# Patient Record
Sex: Female | Born: 1970 | Race: White | Hispanic: No | Marital: Married | Smoking: Never smoker
Health system: Southern US, Community
[De-identification: ages and names within clinical notes are randomized; demographics above are authoritative.]

## PROBLEM LIST (undated history)

## (undated) DIAGNOSIS — N200 Calculus of kidney: Secondary | ICD-10-CM

## (undated) DIAGNOSIS — Z87442 Personal history of urinary calculi: Secondary | ICD-10-CM

## (undated) HISTORY — PX: SINUS IRRIGATION: SHX2411

## (undated) HISTORY — PX: RHINOPLASTY: SUR1284

## (undated) HISTORY — PX: INTRAUTERINE DEVICE INSERTION: SHX323

## (undated) HISTORY — PX: TONSILLECTOMY: SUR1361

---

## 2006-02-11 ENCOUNTER — Observation Stay: Payer: Self-pay

## 2006-02-25 ENCOUNTER — Inpatient Hospital Stay: Payer: Self-pay | Admitting: Obstetrics and Gynecology

## 2006-03-06 ENCOUNTER — Ambulatory Visit: Payer: Self-pay | Admitting: Anesthesiology

## 2006-03-09 ENCOUNTER — Ambulatory Visit: Payer: Self-pay | Admitting: Obstetrics and Gynecology

## 2007-06-04 ENCOUNTER — Ambulatory Visit: Payer: Self-pay | Admitting: Obstetrics and Gynecology

## 2008-06-13 ENCOUNTER — Ambulatory Visit: Payer: Self-pay | Admitting: Obstetrics and Gynecology

## 2009-12-14 ENCOUNTER — Ambulatory Visit: Payer: Self-pay | Admitting: Orthopedic Surgery

## 2011-06-24 ENCOUNTER — Ambulatory Visit: Payer: Self-pay | Admitting: Obstetrics and Gynecology

## 2012-06-29 ENCOUNTER — Ambulatory Visit: Payer: Self-pay | Admitting: Obstetrics and Gynecology

## 2015-05-07 ENCOUNTER — Other Ambulatory Visit: Payer: Self-pay | Admitting: Obstetrics and Gynecology

## 2015-05-07 DIAGNOSIS — Z1231 Encounter for screening mammogram for malignant neoplasm of breast: Secondary | ICD-10-CM

## 2015-05-16 ENCOUNTER — Ambulatory Visit
Admission: RE | Admit: 2015-05-16 | Discharge: 2015-05-16 | Disposition: A | Discharge status: Home / Self Care | Payer: 59 | Source: Ambulatory Visit | Attending: Obstetrics and Gynecology | Admitting: Obstetrics and Gynecology

## 2015-05-16 DIAGNOSIS — Z1231 Encounter for screening mammogram for malignant neoplasm of breast: Secondary | ICD-10-CM | POA: Insufficient documentation

## 2015-05-30 ENCOUNTER — Emergency Department: Payer: 59

## 2015-05-30 ENCOUNTER — Emergency Department
Admission: EM | Admit: 2015-05-30 | Discharge: 2015-05-30 | Disposition: A | Discharge status: Home / Self Care | Payer: 59 | Attending: Emergency Medicine | Admitting: Emergency Medicine

## 2015-05-30 DIAGNOSIS — N83201 Unspecified ovarian cyst, right side: Secondary | ICD-10-CM | POA: Diagnosis not present

## 2015-05-30 DIAGNOSIS — R1031 Right lower quadrant pain: Secondary | ICD-10-CM

## 2015-05-30 DIAGNOSIS — Z3202 Encounter for pregnancy test, result negative: Secondary | ICD-10-CM | POA: Diagnosis not present

## 2015-05-30 DIAGNOSIS — R110 Nausea: Secondary | ICD-10-CM

## 2015-05-30 HISTORY — DX: Calculus of kidney: N20.0

## 2015-05-30 LAB — CBC
HCT: 44.6 % (ref 35.0–47.0)
Hemoglobin: 15 g/dL (ref 12.0–16.0)
MCH: 29.9 pg (ref 26.0–34.0)
MCHC: 33.6 g/dL (ref 32.0–36.0)
MCV: 89.1 fL (ref 80.0–100.0)
PLATELETS: 259 10*3/uL (ref 150–440)
RBC: 5 MIL/uL (ref 3.80–5.20)
RDW: 12.8 % (ref 11.5–14.5)
WBC: 10.9 10*3/uL (ref 3.6–11.0)

## 2015-05-30 LAB — COMPREHENSIVE METABOLIC PANEL
ALT: 27 U/L (ref 14–54)
AST: 22 U/L (ref 15–41)
Albumin: 4.3 g/dL (ref 3.5–5.0)
Alkaline Phosphatase: 55 U/L (ref 38–126)
Anion gap: 7 (ref 5–15)
BUN: 13 mg/dL (ref 6–20)
CHLORIDE: 106 mmol/L (ref 101–111)
CO2: 25 mmol/L (ref 22–32)
CREATININE: 0.75 mg/dL (ref 0.44–1.00)
Calcium: 9 mg/dL (ref 8.9–10.3)
GFR calc non Af Amer: >60 mL/min (ref >60)
Glucose, Bld: 100 mg/dL — ABNORMAL HIGH (ref 65–99)
Potassium: 3.6 mmol/L (ref 3.5–5.1)
SODIUM: 138 mmol/L (ref 135–145)
Total Bilirubin: 0.7 mg/dL (ref 0.3–1.2)
Total Protein: 8.2 g/dL — ABNORMAL HIGH (ref 6.5–8.1)

## 2015-05-30 LAB — URINALYSIS COMPLETE WITH MICROSCOPIC (ARMC ONLY)
BACTERIA UA: NONE SEEN
Bilirubin Urine: NEGATIVE
Glucose, UA: NEGATIVE mg/dL
Ketones, ur: NEGATIVE mg/dL
LEUKOCYTES UA: NEGATIVE
Nitrite: NEGATIVE
PH: 7 (ref 5.0–8.0)
Protein, ur: NEGATIVE mg/dL
Specific Gravity, Urine: 1.008 (ref 1.005–1.030)

## 2015-05-30 LAB — POCT PREGNANCY, URINE: PREG TEST UR: NEGATIVE

## 2015-05-30 LAB — LIPASE, BLOOD: Lipase: 24 U/L (ref 11–51)

## 2015-05-30 MED ORDER — ONDANSETRON HCL 4 MG/2ML IJ SOLN
4.0000 mg | Freq: Once | INTRAMUSCULAR | Status: AC
  Administered 2015-05-30: 4 mg via INTRAVENOUS
  Filled 2015-05-30: qty 2

## 2015-05-30 MED ORDER — HYDROMORPHONE HCL 1 MG/ML IJ SOLN
0.5000 mg | Freq: Once | INTRAMUSCULAR | Status: AC
  Administered 2015-05-30: 0.5 mg via INTRAVENOUS
  Filled 2015-05-30: qty 1

## 2015-05-30 MED ORDER — SODIUM CHLORIDE 0.9 % IV SOLN: 1000.0000 mL | Freq: Once | INTRAVENOUS | Status: DC

## 2015-05-30 MED ORDER — SODIUM CHLORIDE 0.9 % IV BOLUS (SEPSIS)
1000.0000 mL | Freq: Once | INTRAVENOUS | Status: AC
  Administered 2015-05-30: 1000 mL via INTRAVENOUS

## 2015-05-30 MED ORDER — OXYCODONE-ACETAMINOPHEN 5-325 MG PO TABS
1.0000 | ORAL_TABLET | Freq: Once | ORAL | Status: AC
  Administered 2015-05-30: 1 via ORAL
  Filled 2015-05-30: qty 1

## 2015-05-30 MED ORDER — HYDROMORPHONE HCL 1 MG/ML IJ SOLN: 1.0000 mg | Freq: Once | INTRAMUSCULAR | Status: DC

## 2015-05-30 MED ORDER — IOHEXOL 240 MG/ML SOLN
25.0000 mL | Freq: Once | INTRAMUSCULAR | Status: AC | PRN
  Administered 2015-05-30: 25 mL via ORAL

## 2015-05-30 MED ORDER — OXYCODONE HCL 5 MG PO TABS: 5.0000 mg | ORAL_TABLET | ORAL | Status: AC | PRN

## 2015-05-30 MED ORDER — ONDANSETRON HCL 4 MG/2ML IJ SOLN: 4.0000 mg | Freq: Once | INTRAMUSCULAR | Status: DC

## 2015-05-30 MED ORDER — IOHEXOL 300 MG/ML  SOLN
100.0000 mL | Freq: Once | INTRAMUSCULAR | Status: AC | PRN
  Administered 2015-05-30: 100 mL via INTRAVENOUS

## 2015-05-30 MED ORDER — HYDROMORPHONE HCL 1 MG/ML IJ SOLN
1.0000 mg | Freq: Once | INTRAMUSCULAR | Status: AC
  Administered 2015-05-30: 1 mg via INTRAVENOUS
  Filled 2015-05-30: qty 1

## 2015-05-30 NOTE — ED Notes (Signed)
Pt c/o sudden onset RLQ pain that started around 630am this morning .Marland Kitchen. Denies N/V/fever.. States she has hx of kidney stones but this feels different..Marland Kitchen

## 2015-05-30 NOTE — Discharge Instructions (Signed)
Please make an appointment with your primary care physician for follow-up. You  will need a repeat pelvic ultrasound in 6-12 weeks to make sure that your ovarian cyst has resolved.   Please return to the emergency department if you develop severe pain, lightheadedness or fainting, fever, or any other symptoms concerning to you.

## 2015-05-30 NOTE — ED Notes (Signed)
Pt discharged home after verbalizing understanding of discharge instructions; nad noted. 

## 2015-05-30 NOTE — ED Provider Notes (Addendum)
[facility] Emergency Department Provider Note  ____________________________________________  Time seen: Approximately 8:56 AM  I have reviewed the triage vital signs and the nursing notes.   HISTORY  Chief Complaint Abdominal Pain    HPI Mary Ayers is a 44 y.o. female with a history of renal colic presenting with right lower quadrant pain and nausea. Patient states that at 6:15 she began to have a "sharp" pain in the right lower quadrant associated with nausea but no vomiting. No diarrhea, urinary symptoms, or change in vaginal discharge. No fever, chills. She tried to eat 2 bites of breakfast but then was not hungry anymore. The patient is sexually active and has an IUD. LMP 2 weeks ago.   Past Medical History  Diagnosis Date  . Kidney stone     There are no active problems to display for this patient.   Past Surgical History  Procedure Laterality Date  . Sinus irrigation      Current Outpatient Rx  Name  Route  Sig  Dispense  Refill  . oxyCODONE (ROXICODONE) 5 MG immediate release tablet   Oral   Take 1 tablet (5 mg total) by mouth every 4 (four) hours as needed.   10 tablet   0     Allergies Naproxen  No family history on file.  Social History Social History  Substance Use Topics  . Smoking status: Never Smoker   . Smokeless tobacco: None  . Alcohol Use: No    Review of Systems Constitutional: No fever/chills. No lightheadedness or syncope. Eyes: No visual changes. ENT: No sore throat. Cardiovascular: Denies chest pain, palpitations. Respiratory: Denies shortness of breath.  No cough. Gastrointestinal: As right lower quadrant abdominal pain.  Positive nausea, no vomiting.  No diarrhea.  No constipation. Genitourinary: Negative for dysuria. Negative for change in vaginal discharge. Musculoskeletal: Negative for back pain. Skin: Negative for rash. Neurological: Negative for headaches, focal weakness or  numbness.  10-point ROS otherwise negative.  ____________________________________________   PHYSICAL EXAM:  VITAL SIGNS: ED Triage Vitals  Enc Vitals Group     BP 05/30/15 0837 156/91 mmHg     Pulse Rate 05/30/15 0837 118     Resp 05/30/15 0837 18     Temp 05/30/15 0837 98.2 F (36.8 C)     Temp Source 05/30/15 0837 Oral     SpO2 05/30/15 0837 99 %     Weight 05/30/15 0837 140 lb (63.504 kg)     Height 05/30/15 0837 5\' 1"  (1.549 m)     Head Cir --      Peak Flow --      Pain Score 05/30/15 0838 5     Pain Loc --      Pain Edu? --      Excl. in GC? --     Constitutional: Alert and oriented. Well appearing and in no acute distress. Answer question appropriately. Eyes: Conjunctivae are normal.  EOMI. Head: Atraumatic. Nose: No congestion/rhinnorhea. Mouth/Throat: Mucous membranes are moist.  Neck: No stridor.  Supple.   Cardiovascular: Normal rate, regular rhythm. No murmurs, rubs or gallops.  Respiratory: Normal respiratory effort.  No retractions. Lungs CTAB.  No wheezes, rales or ronchi. Gastrointestinal: Soft and nondistended. Tender to palpation in the right lower quadrant. No Murphy sign. No guarding or rebound, no peritoneal signs. Musculoskeletal: No LE edema.  Neurologic:  Normal speech and language. No gross focal neurologic deficits are appreciated.  Skin:  Skin is warm, dry and intact. No rash  noted. Psychiatric: Mood and affect are normal. Speech and behavior are normal.  Normal judgement.  ____________________________________________   LABS (all labs ordered are listed, but only abnormal results are displayed)  Labs Reviewed  URINALYSIS COMPLETEWITH MICROSCOPIC ([facility] ONLY) - Abnormal; Notable for the following:    Color, Urine STRAW (*)    APPearance CLEAR (*)    Hgb urine dipstick 1+ (*)    Squamous Epithelial / LPF 0-5 (*)    All other components within normal limits  COMPREHENSIVE METABOLIC PANEL - Abnormal; Notable for the following:    Glucose,  Bld 100 (*)    Total Protein 8.2 (*)    All other components within normal limits  CBC  LIPASE, BLOOD  POC URINE PREG, ED  POCT PREGNANCY, URINE   ____________________________________________  EKG  Not indicated ____________________________________________  RADIOLOGY  US Transvaginal Non-ob  05/30/2015  CLINICAL DATA:  Acute onset right lower quadrant pain at 6:30 a.m. today. No known injury. EXAM: TRANSABDOMINAL AND TRANSVAGINAL ULTRASOUND OF PELVIS DOPPLER ULTRASOUND OF OVARIES TECHNIQUE: Both transabdominal and transvaginal ultrasound examinations of the pelvis were performed. Transabdominal technique was performed for global imaging of the pelvis including uterus, ovaries, adnexal regions, and pelvic cul-de-sac. It was necessary to proceed with endovaginal exam following the transabdominal exam to visualize the uterus and adnexa. Color and duplex Doppler ultrasound was utilized to evaluate blood flow to the ovaries. COMPARISON:  CT abdomen and pelvis earlier today. FINDINGS: Uterus Measurements: 8.8 x 4.7 x 5.0 cm. No fibroids or other mass visualized. Nabothian cysts are noted within the cervix. The largest measures 1 cm in diameter and contains a small amount of debris. Endometrium Thickness: 0.8 cm. IUD is in place. Several tiny sub endometrial cysts are identified. Right ovary Measurements: 4.5 x 3.6 x 3.3 cm. The majority of the right ovary is replaced by a cystic lesion which contains a single somewhat thin septation measuring 0.2 cm. There is no flow within the septation. No mural nodule is identified. Left ovary Measurements: 1.8 x 1.9 x 1.9 cm. Normal appearance/no adnexal mass. Pulsed Doppler evaluation of both ovaries demonstrates normal low-resistance arterial and venous waveforms. Other findings No free fluid. IMPRESSION: No acute abnormality. Right ovarian cyst with a single thin septation has benign features but follow-up ultrasound in 6-12 weeks is recommended. Electronically  Signed   By: Drusilla Kanner M.D.   On: 05/30/2015 14:21   US Pelvis Complete  05/30/2015  CLINICAL DATA:  Acute onset right lower quadrant pain at 6:30 a.m. today. No known injury. EXAM: TRANSABDOMINAL AND TRANSVAGINAL ULTRASOUND OF PELVIS DOPPLER ULTRASOUND OF OVARIES TECHNIQUE: Both transabdominal and transvaginal ultrasound examinations of the pelvis were performed. Transabdominal technique was performed for global imaging of the pelvis including uterus, ovaries, adnexal regions, and pelvic cul-de-sac. It was necessary to proceed with endovaginal exam following the transabdominal exam to visualize the uterus and adnexa. Color and duplex Doppler ultrasound was utilized to evaluate blood flow to the ovaries. COMPARISON:  CT abdomen and pelvis earlier today. FINDINGS: Uterus Measurements: 8.8 x 4.7 x 5.0 cm. No fibroids or other mass visualized. Nabothian cysts are noted within the cervix. The largest measures 1 cm in diameter and contains a small amount of debris. Endometrium Thickness: 0.8 cm. IUD is in place. Several tiny sub endometrial cysts are identified. Right ovary Measurements: 4.5 x 3.6 x 3.3 cm. The majority of the right ovary is replaced by a cystic lesion which contains a single somewhat thin septation measuring 0.2 cm. There is  no flow within the septation. No mural nodule is identified. Left ovary Measurements: 1.8 x 1.9 x 1.9 cm. Normal appearance/no adnexal mass. Pulsed Doppler evaluation of both ovaries demonstrates normal low-resistance arterial and venous waveforms. Other findings No free fluid. IMPRESSION: No acute abnormality. Right ovarian cyst with a single thin septation has benign features but follow-up ultrasound in 6-12 weeks is recommended. Electronically Signed   By: Drusilla Kanner M.D.   On: 05/30/2015 14:21   Ct Abdomen Pelvis W Contrast  05/30/2015  CLINICAL DATA:  Sudden onset right lower quadrant pain starting at 6:30 this morning. History of renal calculi. EXAM: CT  ABDOMEN AND PELVIS WITH CONTRAST TECHNIQUE: Multidetector CT imaging of the abdomen and pelvis was performed using the standard protocol following bolus administration of intravenous contrast. CONTRAST:  OMNIPAQUE IOHEXOL 300 MG/ML  SOLN COMPARISON:  Report from 09/16/1996. FINDINGS: Lower chest: Dependent subsegmental atelectasis in both lower lobes. Hepatobiliary: Unremarkable Pancreas: Unremarkable Spleen: Unremarkable Adrenals/Urinary Tract: In the left kidney lower pole there is a 0.8 by 0.4 cm cluster of 2 adjacent calculi (image 73, series 6) as well as a separate 3 mm calculus (image 80, series 6). No hydronephrosis or hydroureter. No ureteral calculus or bladder calculus. Adrenal glands normal. Stomach/Bowel: Mobile cecum, positioned in the left upper quadrant, with adjacent pericecal lymph nodes measuring up to 1 cm in short axis (image 35, series 6) with normal appearance of the appendix, no compelling findings of twisting/volvulus. Terminal ileum unremarkable. Vascular/Lymphatic: Normal sized pelvic and small retroperitoneal lymph nodes. Reproductive: IUD in the uterus appears well positioned. Cystic lesion of the right ovary measuring 5.2 by 3.3 by 3.2 cm with at least a single internal septation extending obliquely, as shown on image 51 series 6. Left ovary normal. Other: No supplemental non-categorized findings. Musculoskeletal: Central disc protrusion at L4-5 with internal calcification, extending caudad. No overt impingement. Transitional L5 vertebra. IMPRESSION: 1. A 5.2 cm right ovarian cyst with thin internal septation noted. This is a potential cause for the patient's right lower quadrant pain- dedicated pelvic sonography is recommended for further characterization. 2. Mobile cecum is in the left upper quadrant. No definite twisting or volvulus. Appendix appears unremarkable. 3. Left kidney lower pole nonobstructive calculi. 4. Central disc protrusion at L4-5 without impingement. 5. An IUD  in the uterus appears well positioned. Electronically Signed   By: Gaylyn Rong M.D.   On: 05/30/2015 11:08   [place] Art/ven Flow Abd Pelv Doppler  05/30/2015  CLINICAL DATA:  Acute onset right lower quadrant pain at 6:30 a.m. today. No known injury. EXAM: TRANSABDOMINAL AND TRANSVAGINAL ULTRASOUND OF PELVIS DOPPLER ULTRASOUND OF OVARIES TECHNIQUE: Both transabdominal and transvaginal ultrasound examinations of the pelvis were performed. Transabdominal technique was performed for global imaging of the pelvis including uterus, ovaries, adnexal regions, and pelvic cul-de-sac. It was necessary to proceed with endovaginal exam following the transabdominal exam to visualize the uterus and adnexa. Color and duplex Doppler ultrasound was utilized to evaluate blood flow to the ovaries. COMPARISON:  CT abdomen and pelvis earlier today. FINDINGS: Uterus Measurements: 8.8 x 4.7 x 5.0 cm. No fibroids or other mass visualized. Nabothian cysts are noted within the cervix. The largest measures 1 cm in diameter and contains a small amount of debris. Endometrium Thickness: 0.8 cm. IUD is in place. Several tiny sub endometrial cysts are identified. Right ovary Measurements: 4.5 x 3.6 x 3.3 cm. The majority of the right ovary is replaced by a cystic lesion which contains a single somewhat thin  septation measuring 0.2 cm. There is no flow within the septation. No mural nodule is identified. Left ovary Measurements: 1.8 x 1.9 x 1.9 cm. Normal appearance/no adnexal mass. Pulsed Doppler evaluation of both ovaries demonstrates normal low-resistance arterial and venous waveforms. Other findings No free fluid. IMPRESSION: No acute abnormality. Right ovarian cyst with a single thin septation has benign features but follow-up ultrasound in 6-12 weeks is recommended. Electronically Signed   By: Drusilla Kanner M.D.   On: 05/30/2015 14:21    ____________________________________________   PROCEDURES  Procedure(s) performed:  None  Critical Care performed: No ____________________________________________   INITIAL IMPRESSION / ASSESSMENT AND PLAN / ED COURSE  Pertinent labs & imaging results that were available during my care of the patient were reviewed by me and considered in my medical decision making (see chart for details).  44 y.o. female with a history of renal colic presenting with acute onset of sharp right lower quadrant pain associated with nausea. This is not similar to her previous episodes of renal colic. We'll evaluate the patient for appendicitis, but if his workup is negative O consider other etiologies including ovarian cyst, ovarian torsion which revealed less likely, colitis, or renal pathology.  ----------------------------------------- 11:49 AM on 05/30/2015 -----------------------------------------  Patient states that Dilaudid initially improved her pain but now the pain is coming back. She also states that her nausea has significantly improved with Zofran. Her CT scan shows a complicated right ovarian cyst so I will follow this with ultrasound. She does not have appendicitis.  ----------------------------------------- 2:33 PM on 05/30/2015 -----------------------------------------  Patient's pelvic ultrasound confirms a right ovarian cyst that has a single septation is benign in appearance. I have talked to the patient and let her know the results of her ultrasound, as well as the need to follow-up in 6-12 weeks for repeat ultrasound. Also written this on her discharge paperwork. We have discussed return precautions as well as follow-up instructions.  ____________________________________________  FINAL CLINICAL IMPRESSION(S) / ED DIAGNOSES  Final diagnoses:  Right ovarian cyst  Nausea without vomiting      NEW MEDICATIONS STARTED DURING THIS VISIT:  New Prescriptions   OXYCODONE (ROXICODONE) 5 MG IMMEDIATE RELEASE TABLET    Take 1 tablet (5 mg total) by mouth every 4 (four)  hours as needed.     Rockne Menghini, MD 05/30/15 1434  Rockne Menghini, MD 05/30/15 1434

## 2015-05-30 NOTE — ED Notes (Signed)
Patient transported to Ultrasound 

## 2015-05-30 NOTE — ED Notes (Signed)
Patient transported to CT 

## 2015-06-12 ENCOUNTER — Encounter
Admission: RE | Admit: 2015-06-12 | Discharge: 2015-06-12 | Disposition: A | Discharge status: Home / Self Care | Payer: 59 | Source: Ambulatory Visit | Attending: Orthopedic Surgery | Admitting: Orthopedic Surgery

## 2015-06-12 DIAGNOSIS — Z79899 Other long term (current) drug therapy: Secondary | ICD-10-CM | POA: Diagnosis not present

## 2015-06-12 DIAGNOSIS — Z9889 Other specified postprocedural states: Secondary | ICD-10-CM | POA: Diagnosis not present

## 2015-06-12 DIAGNOSIS — Z79891 Long term (current) use of opiate analgesic: Secondary | ICD-10-CM | POA: Diagnosis not present

## 2015-06-12 DIAGNOSIS — N200 Calculus of kidney: Secondary | ICD-10-CM | POA: Diagnosis not present

## 2015-06-12 LAB — PREGNANCY, URINE: Preg Test, Ur: NEGATIVE

## 2015-06-13 ENCOUNTER — Encounter: Payer: Self-pay | Admitting: *Deleted

## 2015-06-13 MED ORDER — LACTATED RINGERS IV SOLN: INTRAVENOUS | Status: DC

## 2015-06-13 MED ORDER — FAMOTIDINE 20 MG PO TABS: 20.0000 mg | ORAL_TABLET | Freq: Once | ORAL | Status: DC

## 2015-06-14 ENCOUNTER — Ambulatory Visit
Admission: RE | Admit: 2015-06-14 | Discharge: 2015-06-14 | Disposition: A | Discharge status: Home / Self Care | Payer: 59 | Source: Ambulatory Visit | Attending: Urology | Admitting: Urology

## 2015-06-14 ENCOUNTER — Encounter
Admission: RE | Disposition: A | Discharge status: Home / Self Care | Payer: Self-pay | Source: Ambulatory Visit | Attending: Urology

## 2015-06-14 ENCOUNTER — Encounter: Payer: Self-pay | Admitting: *Deleted

## 2015-06-14 DIAGNOSIS — Z79899 Other long term (current) drug therapy: Secondary | ICD-10-CM | POA: Insufficient documentation

## 2015-06-14 DIAGNOSIS — N200 Calculus of kidney: Secondary | ICD-10-CM | POA: Diagnosis not present

## 2015-06-14 DIAGNOSIS — Z79891 Long term (current) use of opiate analgesic: Secondary | ICD-10-CM | POA: Insufficient documentation

## 2015-06-14 DIAGNOSIS — Z9889 Other specified postprocedural states: Secondary | ICD-10-CM | POA: Insufficient documentation

## 2015-06-14 HISTORY — PX: EXTRACORPOREAL SHOCK WAVE LITHOTRIPSY: SHX1557

## 2015-06-14 SURGERY — LITHOTRIPSY, ESWL
Anesthesia: Moderate Sedation | Laterality: Left

## 2015-06-14 MED ORDER — LEVOFLOXACIN 500 MG PO TABS
ORAL_TABLET | ORAL | Status: AC
Start: 2015-06-14 — End: 2015-06-14
  Administered 2015-06-14: 500 mg via ORAL
  Filled 2015-06-14: qty 1

## 2015-06-14 MED ORDER — LEVOFLOXACIN 500 MG PO TABS: 500.0000 mg | ORAL_TABLET | Freq: Every day | ORAL | Status: DC

## 2015-06-14 MED ORDER — MORPHINE SULFATE (PF) 10 MG/ML IV SOLN
10.0000 mg | Freq: Once | INTRAVENOUS | Status: AC
  Administered 2015-06-14: 10 mg via INTRAMUSCULAR

## 2015-06-14 MED ORDER — DIPHENHYDRAMINE HCL 25 MG PO CAPS
25.0000 mg | ORAL_CAPSULE | ORAL | Status: AC
  Administered 2015-06-14: 25 mg via ORAL

## 2015-06-14 MED ORDER — MIDAZOLAM HCL 2 MG/2ML IJ SOLN
INTRAMUSCULAR | Status: AC
  Administered 2015-06-14: 1 mg via INTRAMUSCULAR
  Filled 2015-06-14: qty 2

## 2015-06-14 MED ORDER — DOCUSATE SODIUM 100 MG PO CAPS: 200.0000 mg | ORAL_CAPSULE | Freq: Two times a day (BID) | ORAL | Status: DC

## 2015-06-14 MED ORDER — TAMSULOSIN HCL 0.4 MG PO CAPS: 0.4000 mg | ORAL_CAPSULE | Freq: Every day | ORAL | Status: DC

## 2015-06-14 MED ORDER — MORPHINE SULFATE (PF) 10 MG/ML IV SOLN
INTRAVENOUS | Status: AC
  Administered 2015-06-14: 10 mg via INTRAMUSCULAR
  Filled 2015-06-14: qty 1

## 2015-06-14 MED ORDER — FUROSEMIDE 10 MG/ML IJ SOLN
10.0000 mg | Freq: Once | INTRAMUSCULAR | Status: AC
  Administered 2015-06-14: 10 mg via INTRAVENOUS

## 2015-06-14 MED ORDER — LEVOFLOXACIN 500 MG PO TABS
500.0000 mg | ORAL_TABLET | ORAL | Status: AC
  Administered 2015-06-14: 500 mg via ORAL

## 2015-06-14 MED ORDER — PROMETHAZINE HCL 25 MG/ML IJ SOLN
INTRAMUSCULAR | Status: AC
Start: 2015-06-14 — End: 2015-06-14
  Administered 2015-06-14: 25 mg via INTRAMUSCULAR
  Filled 2015-06-14: qty 1

## 2015-06-14 MED ORDER — DIPHENHYDRAMINE HCL 25 MG PO CAPS
ORAL_CAPSULE | ORAL | Status: AC
  Administered 2015-06-14: 25 mg via ORAL
  Filled 2015-06-14: qty 1

## 2015-06-14 MED ORDER — MIDAZOLAM HCL 2 MG/2ML IJ SOLN
1.0000 mg | Freq: Once | INTRAMUSCULAR | Status: AC
  Administered 2015-06-14: 1 mg via INTRAMUSCULAR

## 2015-06-14 MED ORDER — PROMETHAZINE HCL 25 MG/ML IJ SOLN
25.0000 mg | Freq: Once | INTRAMUSCULAR | Status: AC
  Administered 2015-06-14: 25 mg via INTRAMUSCULAR

## 2015-06-14 MED ORDER — DEXTROSE-NACL 5-0.45 % IV SOLN
INTRAVENOUS | Status: DC
  Administered 2015-06-14: 09:00:00 via INTRAVENOUS

## 2015-06-14 MED ORDER — ONDANSETRON 8 MG PO TBDP: 8.0000 mg | ORAL_TABLET | Freq: Four times a day (QID) | ORAL | Status: DC | PRN

## 2015-06-14 MED ORDER — FUROSEMIDE 10 MG/ML IJ SOLN
INTRAMUSCULAR | Status: AC
  Administered 2015-06-14: 10 mg via INTRAVENOUS
  Filled 2015-06-14: qty 2

## 2015-06-14 MED ORDER — NUCYNTA 50 MG PO TABS: 50.0000 mg | ORAL_TABLET | Freq: Four times a day (QID) | ORAL | Status: DC | PRN

## 2015-06-14 NOTE — OR Nursing (Signed)
Left flank with slight redness but no break in the skin.

## 2015-06-14 NOTE — Discharge Instructions (Addendum)
Dietary Guidelines to Help Prevent Kidney Stones °Your risk of kidney stones can be decreased by adjusting the foods you eat. The most important thing you can do is drink enough fluid. You should drink enough fluid to keep your urine clear or pale yellow. The following guidelines provide specific information for the type of kidney stone you have had. °GUIDELINES ACCORDING TO TYPE OF KIDNEY STONE °Calcium Oxalate Kidney Stones °· Reduce the amount of salt you eat. Foods that have a lot of salt cause your body to release excess calcium into your urine. The excess calcium can combine with a substance called oxalate to form kidney stones. °· Reduce the amount of animal protein you eat if the amount you eat is excessive. Animal protein causes your body to release excess calcium into your urine. Ask your dietitian how much protein from animal sources you should be eating. °· Avoid foods that are high in oxalates. If you take vitamins, they should have less than 500 mg of vitamin C. Your body turns vitamin C into oxalates. You do not need to avoid fruits and vegetables high in vitamin C. °Calcium Phosphate Kidney Stones °· Reduce the amount of salt you eat to help prevent the release of excess calcium into your urine. °· Reduce the amount of animal protein you eat if the amount you eat is excessive. Animal protein causes your body to release excess calcium into your urine. Ask your dietitian how much protein from animal sources you should be eating. °· Get enough calcium from food or take a calcium supplement (ask your dietitian for recommendations). Food sources of calcium that do not increase your risk of kidney stones include: °· Broccoli. °· Dairy products, such as cheese and yogurt. °· Pudding. °Uric Acid Kidney Stones °· Do not have more than 6 oz of animal protein per day. °FOOD SOURCES °Animal Protein Sources °· Meat (all types). °· Poultry. °· Eggs. °· Fish, seafood. °Foods High in Salt °· Salt seasonings. °· Soy  sauce. °· Teriyaki sauce. °· Cured and processed meats. °· Salted crackers and snack foods. °· Fast food. °· Canned soups and most canned foods. °Foods High in Oxalates °· Grains: °· Amaranth. °· Barley. °· Grits. °· Wheat germ. °· Bran. °· Buckwheat flour. °· All bran cereals. °· Pretzels. °· Whole wheat bread. °· Vegetables: °· Beans (wax). °· Beets and beet greens. °· Collard greens. °· Eggplant. °· Escarole. °· Leeks. °· Okra. °· Parsley. °· Rutabagas. °· Spinach. °· Swiss chard. °· Tomato paste. °· Fried potatoes. °· Sweet potatoes. °· Fruits: °· Red currants. °· Figs. °· Kiwi. °· Rhubarb. °· Meat and Other Protein Sources: °· Beans (dried). °· Soy burgers and other soybean products. °· Miso. °· Nuts (peanuts, almonds, pecans, cashews, hazelnuts). °· Nut butters. °· Sesame seeds and tahini (paste made of sesame seeds). °· Poppy seeds. °· Beverages: °· Chocolate drink mixes. °· Soy milk. °· Instant iced tea. °· Juices made from high-oxalate fruits or vegetables. °· Other: °· Carob. °· Chocolate. °· Fruitcake. °· Marmalades. °  °This information is not intended to replace advice given to you by your health care provider. Make sure you discuss any questions you have with your health care provider. °  °Document Released: 09/27/2010 Document Revised: 06/07/2013 Document Reviewed: 04/29/2013 °Elsevier Interactive Patient Education ©2016 [employer] ° °Kidney Stones °Kidney stones (urolithiasis) are deposits that form inside your kidneys. The intense pain is caused by the stone moving through the urinary tract. When the stone moves, the ureter   goes into spasm around the stone. The stone is usually passed in the urine.  °CAUSES  °· A disorder that makes certain neck glands produce too much parathyroid hormone (primary hyperparathyroidism). °· A buildup of uric acid crystals, similar to gout in your joints. °· Narrowing (stricture) of the ureter. °· A kidney obstruction present at birth (congenital  obstruction). °· Previous surgery on the kidney or ureters. °· Numerous kidney infections. °SYMPTOMS  °· Feeling sick to your stomach (nauseous). °· Throwing up (vomiting). °· Blood in the urine (hematuria). °· Pain that usually spreads (radiates) to the groin. °· Frequency or urgency of urination. °DIAGNOSIS  °· Taking a history and physical exam. °· Blood or urine tests. °· CT scan. °· Occasionally, an examination of the inside of the urinary bladder (cystoscopy) is performed. °TREATMENT  °· Observation. °· Increasing your fluid intake. °· Extracorporeal shock wave lithotripsy--This is a noninvasive procedure that uses shock waves to break up kidney stones. °· Surgery may be needed if you have severe pain or persistent obstruction. There are various surgical procedures. Most of the procedures are performed with the use of small instruments. Only small incisions are needed to accommodate these instruments, so recovery time is minimized. °The size, location, and chemical composition are all important variables that will determine the proper choice of action for you. Talk to your health care provider to better understand your situation so that you will minimize the risk of injury to yourself and your kidney.  °HOME CARE INSTRUCTIONS  °· Drink enough water and fluids to keep your urine clear or pale yellow. This will help you to pass the stone or stone fragments. °· Strain all urine through the provided strainer. Keep all particulate matter and stones for your health care provider to see. The stone causing the pain may be as small as a grain of salt. It is very important to use the strainer each and every time you pass your urine. The collection of your stone will allow your health care provider to analyze it and verify that a stone has actually passed. The stone analysis will often identify what you can do to reduce the incidence of recurrences. °· Only take over-the-counter or prescription medicines for pain,  discomfort, or fever as directed by your health care provider. °· Keep all follow-up visits as told by your health care provider. This is important. °· Get follow-up X-rays if required. The absence of pain does not always mean that the stone has passed. It may have only stopped moving. If the urine remains completely obstructed, it can cause loss of kidney function or even complete destruction of the kidney. It is your responsibility to make sure X-rays and follow-ups are completed. Ultrasounds of the kidney can show blockages and the status of the kidney. Ultrasounds are not associated with any radiation and can be performed easily in a matter of minutes. °· Make changes to your daily diet as told by your health care provider. You may be told to: °· Limit the amount of salt that you eat. °· Eat 5 or more servings of fruits and vegetables each day. °· Limit the amount of meat, poultry, fish, and eggs that you eat. °· Collect a 24-hour urine sample as told by your health care provider. You may need to collect another urine sample every 6-12 months. °SEEK MEDICAL CARE IF: °· You experience pain that is progressive and unresponsive to any pain medicine you have been prescribed. °SEEK IMMEDIATE MEDICAL CARE IF:  °· Pain   cannot be controlled with the prescribed medicine. °· You have a fever or shaking chills. °· The severity or intensity of pain increases over 18 hours and is not relieved by pain medicine. °· You develop a new onset of abdominal pain. °· You feel faint or pass out. °· You are unable to urinate. °  °This information is not intended to replace advice given to you by your health care provider. Make sure you discuss any questions you have with your health care provider. °  °Document Released: 06/02/2005 Document Revised: 02/21/2015 Document Reviewed: 11/03/2012 °Elsevier Interactive Patient Education ©2016 [employer] ° °Lithotripsy, Care After °Refer to this sheet in the next few weeks. These instructions  provide you with information on caring for yourself after your procedure. Your health care provider may also give you more specific instructions. Your treatment has been planned according to current medical practices, but problems sometimes occur. Call your health care provider if you have any problems or questions after your procedure. °WHAT TO EXPECT AFTER THE PROCEDURE  °· Your urine may have a red tinge for a few days after treatment. Blood loss is usually minimal. °· You may have soreness in the back or flank area. This usually goes away after a few days. The procedure can cause blotches or bruises on the back where the pressure wave enters the skin. These marks usually cause only minimal discomfort and should disappear in a short time. °· Stone fragments should begin to pass within 24 hours of treatment. However, a delayed passage is not unusual. °· You may have pain, discomfort, and feel sick to your stomach (nauseated) when the crushed fragments of stone are passed down the tube from the kidney to the bladder. Stone fragments can pass soon after the procedure and may last for up to 4-8 weeks. °· A small number of patients may have severe pain when stone fragments are not able to pass, which leads to an obstruction. °· If your stone is greater than 1 inch (2.5 cm) in diameter or if you have multiple stones that have a combined diameter greater than 1 inch (2.5 cm), you may require more than one treatment. °· If you had a stent placed prior to your procedure, you may experience some discomfort, especially during urination. You may experience the pain or discomfort in your flank or back, or you may experience a sharp pain or discomfort at the base of your penis or in your lower abdomen. The discomfort usually lasts only a few minutes after urinating. °HOME CARE INSTRUCTIONS  °· Rest at home until you feel your energy improving. °· Only take over-the-counter or prescription medicines for pain, discomfort, or  fever as directed by your health care provider. Depending on the type of lithotripsy, you may need to take antibiotics and anti-inflammatory medicines for a few days. °· Drink enough water and fluids to keep your urine clear or pale yellow. This helps "flush" your kidneys. It helps pass any remaining pieces of stone and prevents stones from coming back. °· Most people can resume daily activities within 1-2 days after standard lithotripsy. It can take longer to recover from laser and percutaneous lithotripsy. °· Strain all urine through the provided strainer. Keep all particulate matter and stones for your health care provider to see. The stone may be as small as a grain of salt. It is very important to use the strainer each and every time you pass your urine. Any stones that are found can be sent to   a medical lab for examination. °· Visit your health care provider for a follow-up appointment in a few weeks. Your doctor may remove your stent if you have one. Your health care provider will also check to see whether stone particles still remain. °SEEK MEDICAL CARE IF:  °· Your pain is not relieved by medicine. °· You have a lasting nauseous feeling. °· You feel there is too much blood in the urine. °· You develop persistent problems with frequent or painful urination that does not at least partially improve after 2 days following the procedure. °· You have a congested cough. °· You feel lightheaded. °· You develop a rash or any other signs that might suggest an allergic problem. °· You develop any reaction or side effects to your medicine(s). °SEEK IMMEDIATE MEDICAL CARE IF:  °· You experience severe back or flank pain or both. °· You see nothing but blood when you urinate. °· You cannot pass any urine at all. °· You have a fever or shaking chills. °· You develop shortness of breath, difficulty breathing, or chest pain. °· You develop vomiting that will not stop after 6-8 hours. °· You have a fainting episode. °  °This  information is not intended to replace advice given to you by your health care provider. Make sure you discuss any questions you have with your health care provider. °  °Document Released: 06/22/2007 Document Revised: 02/21/2015 Document Reviewed: 12/16/2012 °Elsevier Interactive Patient Education ©2016 [employer] ° °Lithotripsy °Lithotripsy is a treatment that can sometimes help eliminate kidney stones and pain that they cause. A form of lithotripsy, also known as extracorporeal shock wave lithotripsy, is a nonsurgical procedure that helps your body rid itself of the kidney stone when it is too big to pass on its own. Extracorporeal shock wave lithotripsy is a method of crushing a kidney stone with shock waves. These shock waves pass through your body and are focused on your stone. They cause the kidney stones to crumble while still in the urinary tract. It is then easier for the smaller pieces of stone to pass in the urine. °Lithotripsy usually takes about an hour. It is done in a hospital, a lithotripsy center, or a mobile unit. It usually does not require an overnight stay. Your health care provider will instruct you on preparation for the procedure. Your health care provider will tell you what to expect afterward. °LET YOUR HEALTH CARE PROVIDER KNOW ABOUT: °· Any allergies you have. °· All medicines you are taking, including vitamins, herbs, eye drops, creams, and over-the-counter medicines. °· Previous problems you or members of your family have had with the use of anesthetics. °· Any blood disorders you have. °· Previous surgeries you have had. °· Medical conditions you have. °RISKS AND COMPLICATIONS °Generally, lithotripsy for kidney stones is a safe procedure. However, as with any procedure, complications can occur. Possible complications include: °· Infection. °· Bleeding of the kidney. °· Bruising of the kidney or skin. °· Obstruction of the ureter. °· Failure of the stone to fragment. °BEFORE THE  PROCEDURE °· Do not eat or drink for 6-8 hours prior to the procedure. You may, however, take the medications with a sip of water that your physician instructs you to take °· Do not take aspirin or aspirin-containing products for 7 days prior to your procedure °· Do not take nonsteroidal anti-inflammatory products for 7 days prior to your procedure °PROCEDURE °A stent (flexible tube with holes) may be placed in your ureter. The ureter is   the tube that transports the urine from the kidneys to the bladder. Your health care provider may place a stent before the procedure. This will help keep urine flowing from the kidney if the fragments of the stone block the ureter. You may have an IV tube placed in one of your veins to give you fluids and medicines. These medicines may help you relax or make you sleep. During the procedure, you will lie comfortably on a fluid-filled cushion or in a warm-water bath. After an X-ray or ultrasound exam to locate your stone, shock waves are aimed at the stone. If you are awake, you may feel a tapping sensation as the shock waves pass through your body. If large stone particles remain after treatment, a second procedure may be necessary at a later date. °For comfort during the test: °· Relax as much as possible. °· Try to remain still as much as possible. °· Try to follow instructions to speed up the test. °· Let your health care provider know if you are uncomfortable, anxious, or in pain. °AFTER THE PROCEDURE  °After surgery, you will be taken to the recovery area. A nurse will watch and check your progress. Once you're awake, stable, and taking fluids well, you will be allowed to go home as long as there are no problems. You will also be allowed to pass your urine before discharge. You may be given antibiotics to help prevent infection. You may also be prescribed pain medicine if needed. In a week or two, your health care provider may remove your stent, if you have one. You may first  have an X-ray exam to check on how successful the fragmentation of your stone has been and how much of the stone has passed. Your health care provider will check to see whether or not stone particles remain. °SEEK IMMEDIATE MEDICAL CARE IF: °· You develop a fever or shaking chills. °· Your pain is not relieved by medicine. °· You feel sick to your stomach (nauseated) and you vomit. °· You develop heavy bleeding. °· You have difficulty urinating. °· You start to pass your stent from your penis. °  °This information is not intended to replace advice given to you by your health care provider. Make sure you discuss any questions you have with your health care provider. °  °Document Released: 05/30/2000 Document Revised: 06/23/2014 Document Reviewed: 12/16/2012 °Elsevier Interactive Patient Education ©2016 [employer] ° °Renal Colic °Renal colic is pain that is caused by passing a kidney stone. The pain can be sharp and severe. It may be felt in the back, abdomen, side (flank), or groin. It can cause nausea. Renal colic can come and go. °HOME CARE INSTRUCTIONS °Watch your condition for any changes. The following actions may help to lessen any discomfort that you are feeling: °· Take medicines only as directed by your health care provider. °· Ask your health care provider if it is okay to take over-the-counter pain medicine. °· Drink enough fluid to keep your urine clear or pale yellow. Drink 6-8 glasses of water each day. °· Limit the amount of salt that you eat to less than 2 grams per day. °· Reduce the amount of protein in your diet. Eat less meat, fish, nuts, and dairy. °· Avoid foods such as spinach, rhubarb, nuts, or bran. These may make kidney stones more likely to form. °SEEK MEDICAL CARE IF: °· You have a fever or chills. °· Your urine smells bad or looks cloudy. °· You have pain or   burning when you pass urine. °SEEK IMMEDIATE MEDICAL CARE IF: °· Your flank pain or groin pain suddenly worsens. °· You become  confused or disoriented or you lose consciousness. °  °This information is not intended to replace advice given to you by your health care provider. Make sure you discuss any questions you have with your health care provider. °  °Document Released: 03/12/2005 Document Revised: 06/23/2014 Document Reviewed: 04/12/2014 °Elsevier Interactive Patient Education ©2016 [employer] ° °

## 2015-06-14 NOTE — OR Nursing (Signed)
Urine bloody but without clots or fragments.

## 2016-03-23 IMAGING — US US TRANSVAGINAL NON-OB
1 series · 13 of 25 positions shown · non-contrast
Comparison: CT abdomen and pelvis earlier today.

CLINICAL DATA: Acute onset right lower quadrant pain at [DATE] a.m.
today. No known injury.

EXAM:
TRANSABDOMINAL AND TRANSVAGINAL ULTRASOUND OF PELVIS
DOPPLER ULTRASOUND OF OVARIES
TECHNIQUE: Both transabdominal and transvaginal ultrasound examinations of the
pelvis were performed. Transabdominal technique was performed for
global imaging of the pelvis including uterus, ovaries, adnexal
regions, and pelvic cul-de-sac.
It was necessary to proceed with endovaginal exam following the
transabdominal exam to visualize the uterus and adnexa. Color and
duplex Doppler ultrasound was utilized to evaluate blood flow to the
ovaries.

[Series 1: us transvaginal non-ob · 0.22mm/px · 13 of 159 slices shown]
[im 1/159]
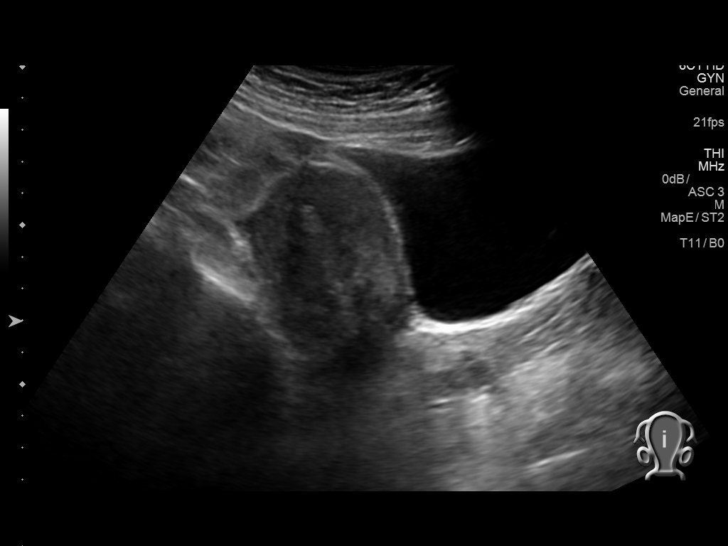
[im 14/159]
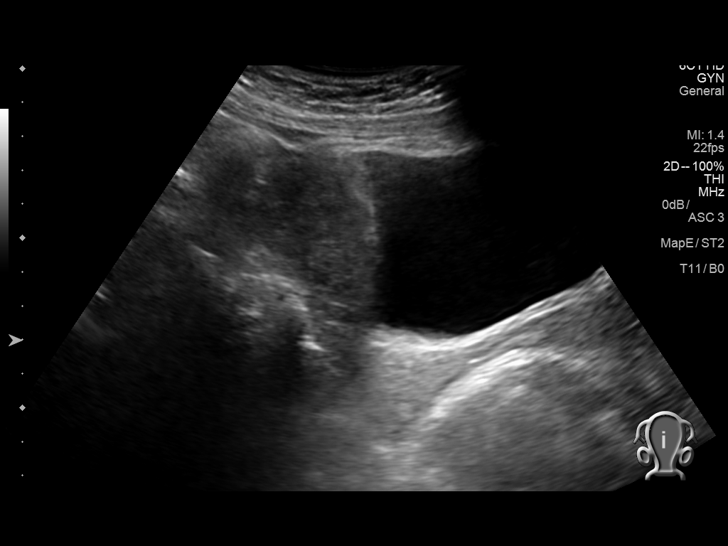
[im 27/159]
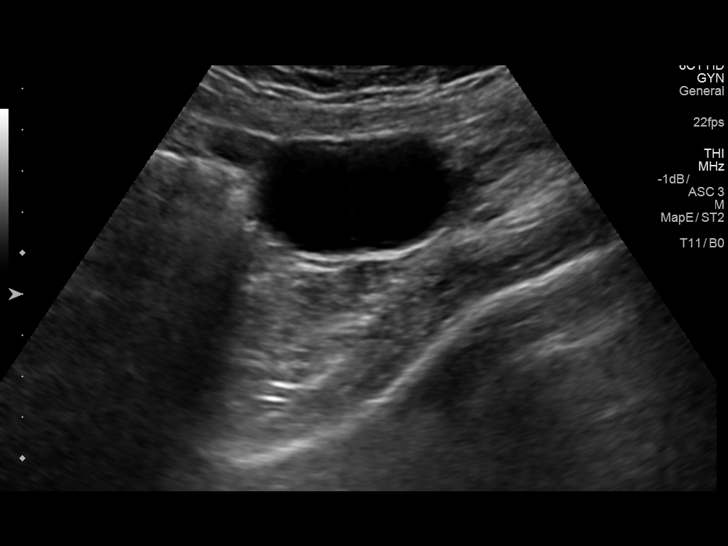
[im 40/159]
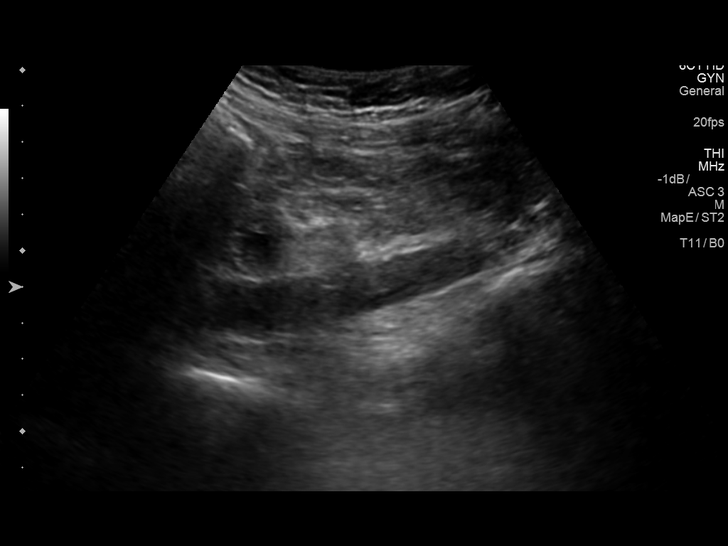
[im 53/159]
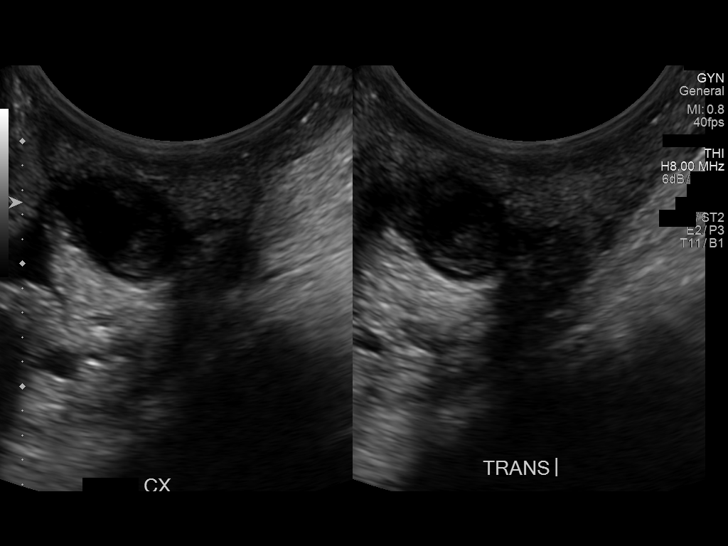
[im 66/159]
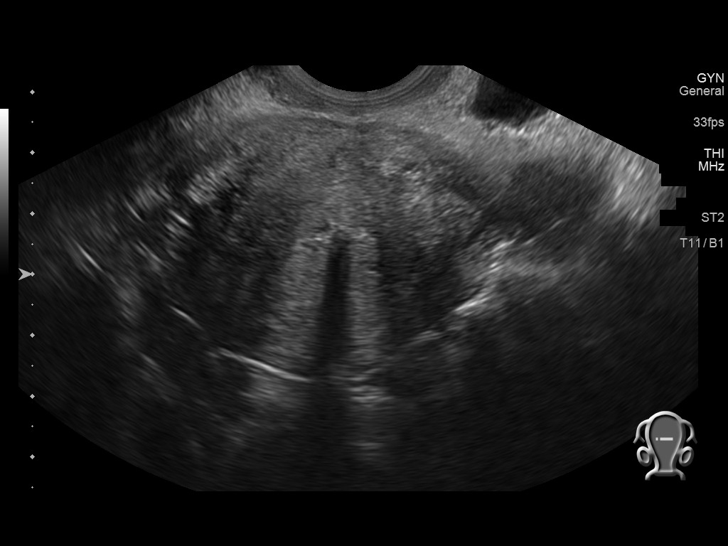
[im 80/159]
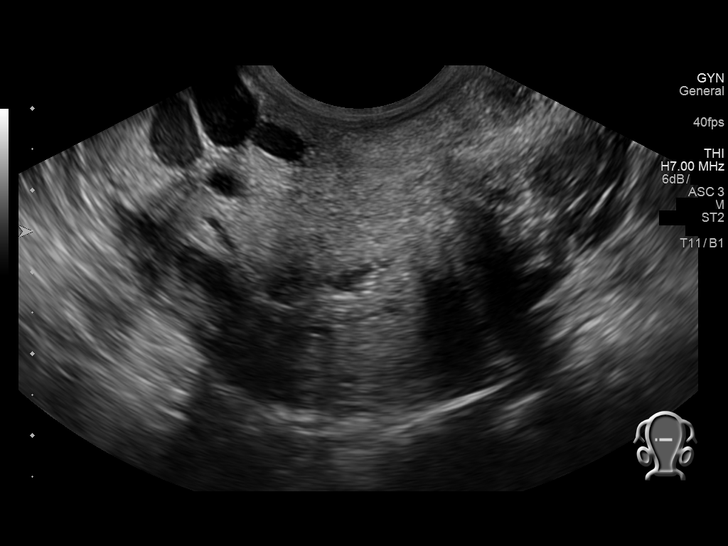
[im 93/159]
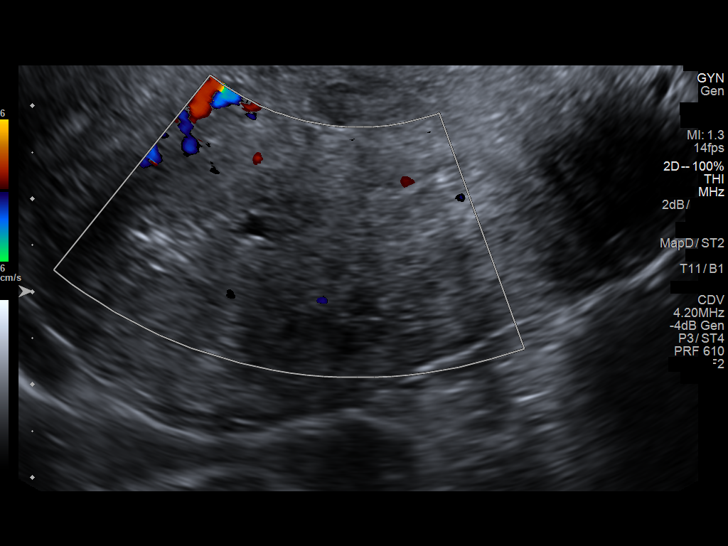
[im 106/159]
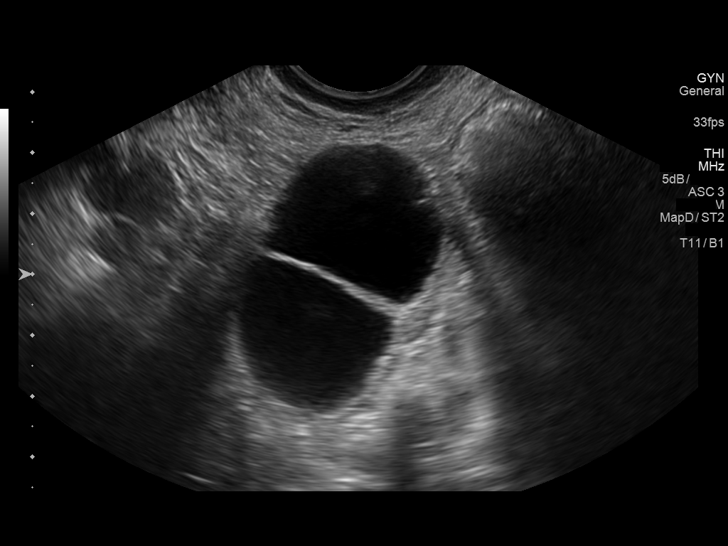
[im 119/159]
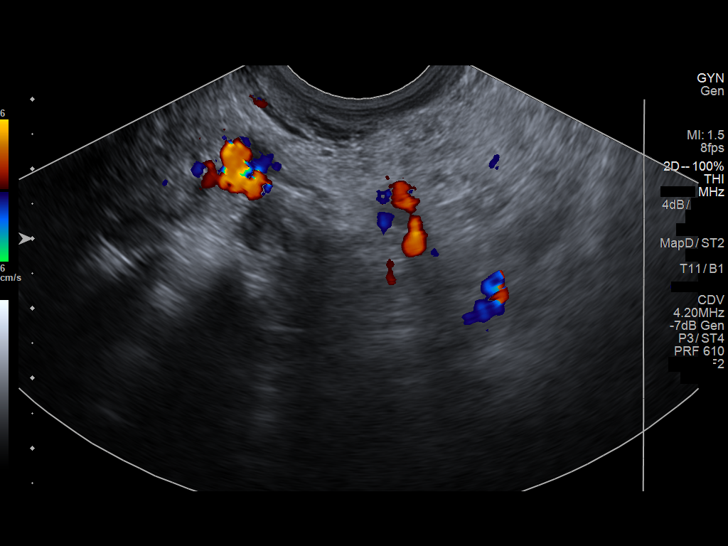
[im 132/159]
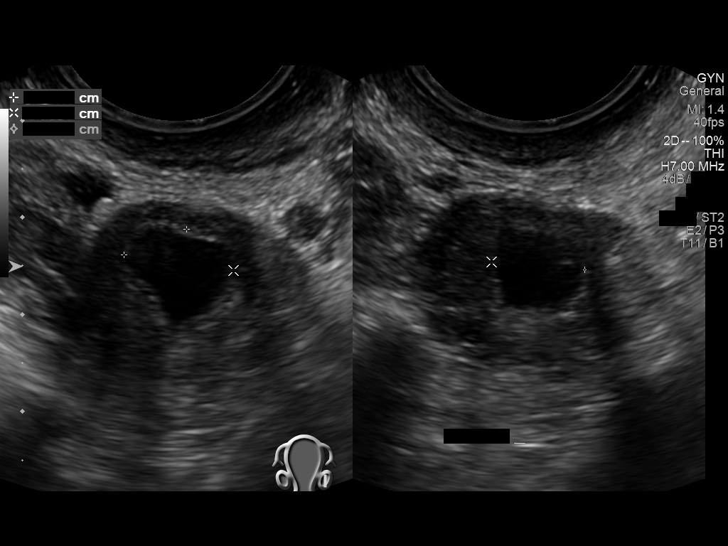
[im 145/159]
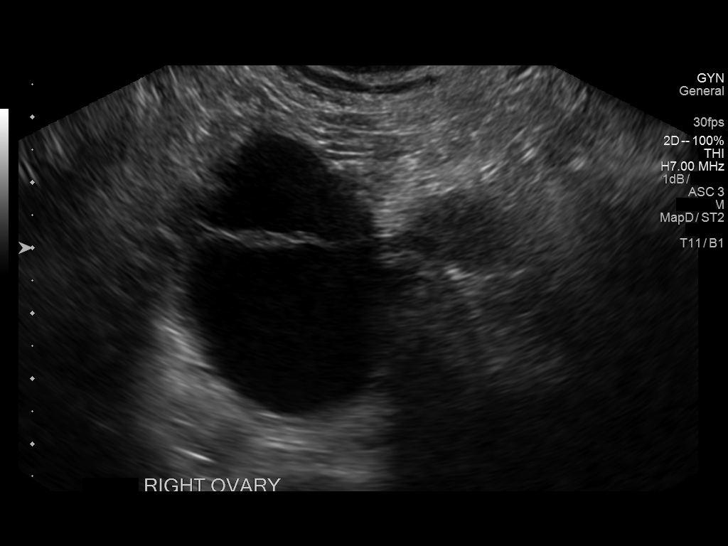
[im 159/159]
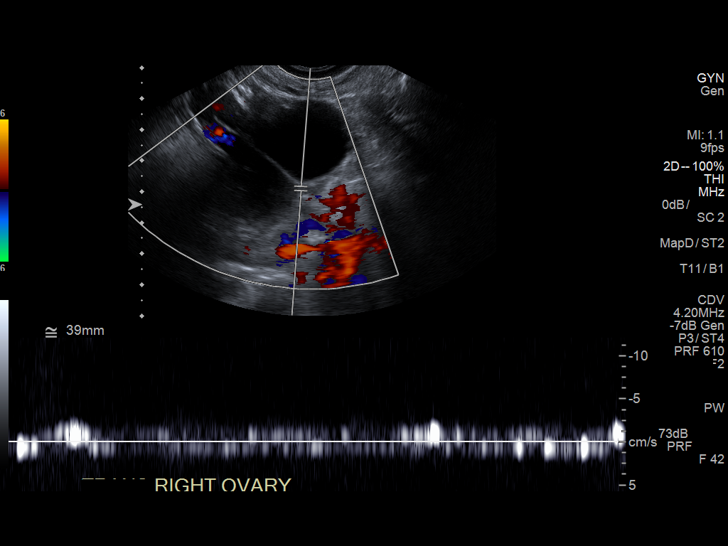

[13 of 25 positions shown; findings below may reference images not displayed]

FINDINGS: Uterus

Measurements: 8.8 x 4.7 x 5.0 cm. No fibroids or other mass
visualized. Nabothian cysts are noted within the cervix. The largest
measures 1 cm in diameter and contains a small amount of debris.

Endometrium

Thickness: 0.8 cm. IUD is in place. Several tiny sub endometrial
cysts are identified.

Right ovary

Measurements: 4.5 x 3.6 x 3.3 cm. The majority of the right ovary is
replaced by a cystic lesion which contains a single somewhat thin
septation measuring 0.2 cm. There is no flow within the septation.
No mural nodule is identified.

Left ovary

Measurements: 1.8 x 1.9 x 1.9 cm. Normal appearance/no adnexal mass.

Pulsed Doppler evaluation of both ovaries demonstrates normal
low-resistance arterial and venous waveforms.

Other findings

No free fluid.
IMPRESSION: No acute abnormality.

Right ovarian cyst with a single thin septation has benign features
but follow-up ultrasound in 6-12 weeks is recommended.

## 2016-04-29 DIAGNOSIS — N200 Calculus of kidney: Secondary | ICD-10-CM | POA: Insufficient documentation

## 2016-06-12 ENCOUNTER — Other Ambulatory Visit: Payer: Self-pay | Admitting: Obstetrics and Gynecology

## 2016-06-12 DIAGNOSIS — Z1231 Encounter for screening mammogram for malignant neoplasm of breast: Secondary | ICD-10-CM

## 2016-07-09 ENCOUNTER — Encounter: Payer: Self-pay | Admitting: *Deleted

## 2016-07-09 ENCOUNTER — Ambulatory Visit (INDEPENDENT_AMBULATORY_CARE_PROVIDER_SITE_OTHER): Payer: 59 | Admitting: Podiatry

## 2016-07-09 ENCOUNTER — Other Ambulatory Visit: Payer: Self-pay | Admitting: *Deleted

## 2016-07-09 ENCOUNTER — Encounter: Payer: Self-pay | Admitting: Podiatry

## 2016-07-09 VITALS — BP 114/71 | HR 86 | Resp 16

## 2016-07-09 DIAGNOSIS — L603 Nail dystrophy: Secondary | ICD-10-CM

## 2016-07-09 DIAGNOSIS — N83201 Unspecified ovarian cyst, right side: Secondary | ICD-10-CM | POA: Insufficient documentation

## 2016-07-09 DIAGNOSIS — R102 Pelvic and perineal pain: Secondary | ICD-10-CM | POA: Insufficient documentation

## 2016-07-09 DIAGNOSIS — L03031 Cellulitis of right toe: Secondary | ICD-10-CM | POA: Diagnosis not present

## 2016-07-09 MED ORDER — NEOMYCIN-POLYMYXIN-HC 1 % OT SOLN: OTIC | 1 refills | Status: DC

## 2016-07-09 NOTE — Progress Notes (Signed)
   Subjective:    Patient ID: Mary Ayers, female    DOB: [birth year], 46 y.o.   MRN: [id number]  HPI: She presents today as a new patient with trauma to the right hallux states that she fell in September 2017 and bruised the toe and as the nail has been growing out the distal aspect of the toenail has become discolored and has split and is throbbing. She states that she has to keep a Band-Aid on it just to keep it from catching in her socks.    Review of Systems  All other systems reviewed and are negative.      Objective:   Physical Exam: Vital signs are stable alert and oriented 3. Pulses are palpable. Neurologic sensorium is intact. Deep tendon reflexes are intact. Muscle strength is normal symmetrical bilateral. Orthopedic evaluation deferred. Cutaneous evaluationof a well-hydrated cutis no erythema edema cellulitis drainage or odor hallux nail plate is full and partially of all distally. It is exquisitely tender on palpation to see no signs of infection.          Assessment & Plan:  Nail trauma with distal onycholysis and splitting of the nail.  Plan: Nail avulsion after local anesthesia was achieved she tolerated procedure well was provided with oral and written home-going instructions for care and soaking of her toe. She was also provided with a prescription for Cortisporin Otic be applied twice daily at soaking I'll follow up with her in 1-2 weeks.

## 2016-07-09 NOTE — Patient Instructions (Signed)

## 2016-07-14 ENCOUNTER — Ambulatory Visit
Admission: RE | Admit: 2016-07-14 | Discharge: 2016-07-14 | Disposition: A | Discharge status: Home / Self Care | Payer: 59 | Source: Ambulatory Visit | Attending: Obstetrics and Gynecology | Admitting: Obstetrics and Gynecology

## 2016-07-14 ENCOUNTER — Other Ambulatory Visit: Payer: Self-pay | Admitting: Obstetrics and Gynecology

## 2016-07-14 DIAGNOSIS — N6489 Other specified disorders of breast: Secondary | ICD-10-CM | POA: Diagnosis not present

## 2016-07-14 DIAGNOSIS — Z1231 Encounter for screening mammogram for malignant neoplasm of breast: Secondary | ICD-10-CM

## 2016-07-17 ENCOUNTER — Other Ambulatory Visit: Payer: Self-pay | Admitting: Obstetrics and Gynecology

## 2016-07-17 DIAGNOSIS — R928 Other abnormal and inconclusive findings on diagnostic imaging of breast: Secondary | ICD-10-CM

## 2016-07-17 DIAGNOSIS — N6320 Unspecified lump in the left breast, unspecified quadrant: Secondary | ICD-10-CM

## 2016-07-17 DIAGNOSIS — N6489 Other specified disorders of breast: Secondary | ICD-10-CM

## 2016-07-21 ENCOUNTER — Encounter: Payer: Self-pay | Admitting: Podiatry

## 2016-07-21 ENCOUNTER — Ambulatory Visit (INDEPENDENT_AMBULATORY_CARE_PROVIDER_SITE_OTHER): Payer: Self-pay | Admitting: Podiatry

## 2016-07-21 DIAGNOSIS — L03031 Cellulitis of right toe: Secondary | ICD-10-CM

## 2016-07-21 NOTE — Progress Notes (Signed)
She presents today for follow-up of her nail avulsion hallux right she states this seems to be doing pretty good is still little touchy at times.  Objective: Portion of the nail still remaining no erythema edema cellulitis drainage or odor.  Assessment: Well-healing surgical toe hallux right.  Plan: I encouraged her to soak in Epsom salts and warm water but I also encouraged her to continue to apply Vaseline to the tip of the nail.

## 2016-07-21 NOTE — Patient Instructions (Signed)

## 2016-07-29 ENCOUNTER — Ambulatory Visit
Admission: RE | Admit: 2016-07-29 | Discharge: 2016-07-29 | Disposition: A | Discharge status: Home / Self Care | Payer: 59 | Source: Ambulatory Visit | Attending: Obstetrics and Gynecology | Admitting: Obstetrics and Gynecology

## 2016-07-29 DIAGNOSIS — R928 Other abnormal and inconclusive findings on diagnostic imaging of breast: Secondary | ICD-10-CM

## 2016-07-29 DIAGNOSIS — N651 Disproportion of reconstructed breast: Secondary | ICD-10-CM | POA: Diagnosis present

## 2016-07-29 DIAGNOSIS — N6489 Other specified disorders of breast: Secondary | ICD-10-CM | POA: Insufficient documentation

## 2016-07-29 DIAGNOSIS — N63 Unspecified lump in unspecified breast: Secondary | ICD-10-CM | POA: Diagnosis present

## 2016-07-29 DIAGNOSIS — N6320 Unspecified lump in the left breast, unspecified quadrant: Secondary | ICD-10-CM

## 2016-09-22 ENCOUNTER — Other Ambulatory Visit: Payer: Self-pay | Admitting: Obstetrics and Gynecology

## 2016-09-22 DIAGNOSIS — N6489 Other specified disorders of breast: Secondary | ICD-10-CM

## 2017-01-15 IMAGING — CT CT ABD-PELV W/ CM
1 of 3 series · 13 of 32 positions shown, 18 images · IV contrast (omnipaque)
Comparison: Report from 09/16/1996.

CLINICAL DATA: Sudden onset right lower quadrant pain starting at
[DATE] this morning. History of renal calculi.

EXAM:
CT ABDOMEN AND PELVIS WITH CONTRAST
TECHNIQUE: Multidetector CT imaging of the abdomen and pelvis was performed
using the standard protocol following bolus administration of
intravenous contrast.
CONTRAST:  100mL OMNIPAQUE IOHEXOL 300 MG/ML  SOLN

[Series 2: routine abd pel with · axial · 0.70mm/px · z∈[-794,-400]mm · 13 of 89 slices shown, 18 images]
[im 5/89  soft-tissue]
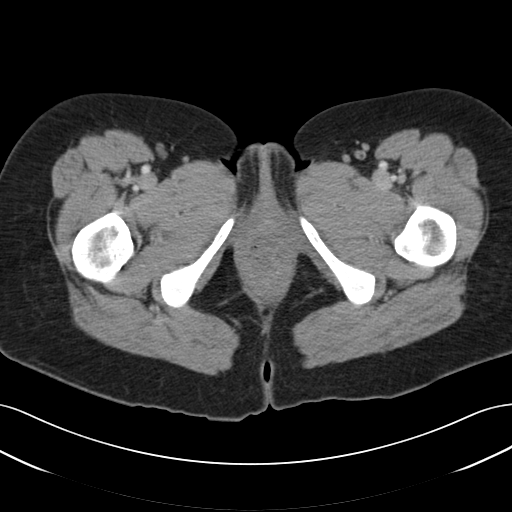
[im 5/89  bone]
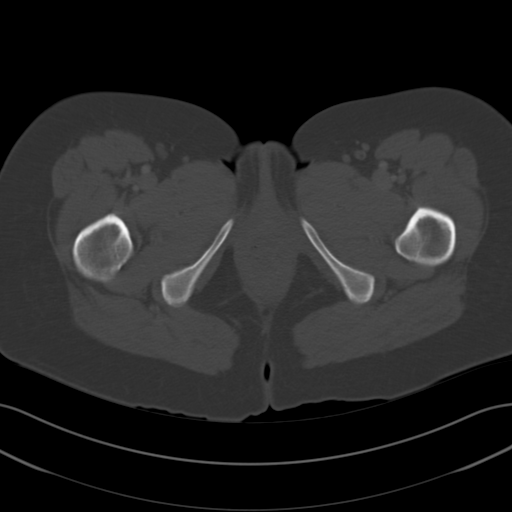
[im 15/89  soft-tissue]
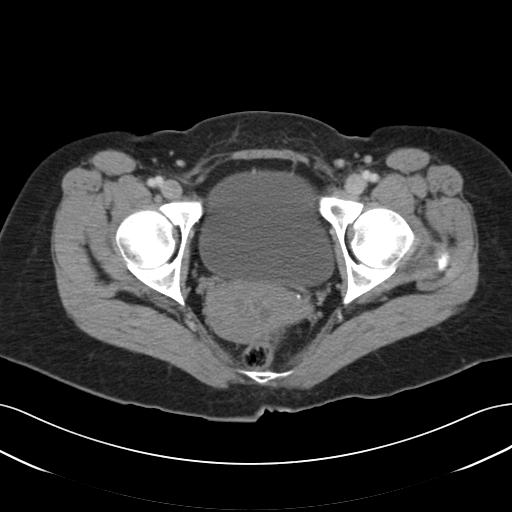
[im 20/89  soft-tissue]
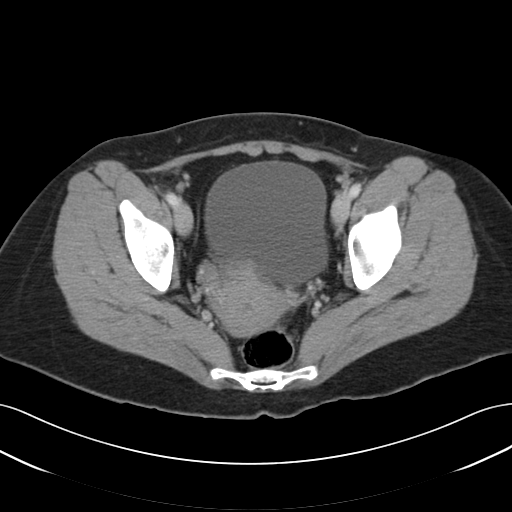
[im 25/89  soft-tissue]
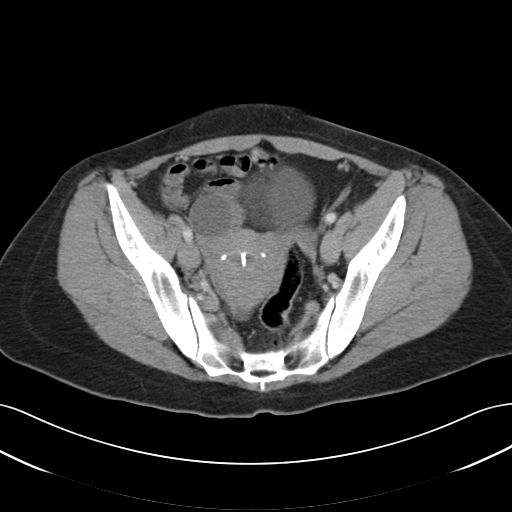
[im 35/89  soft-tissue]
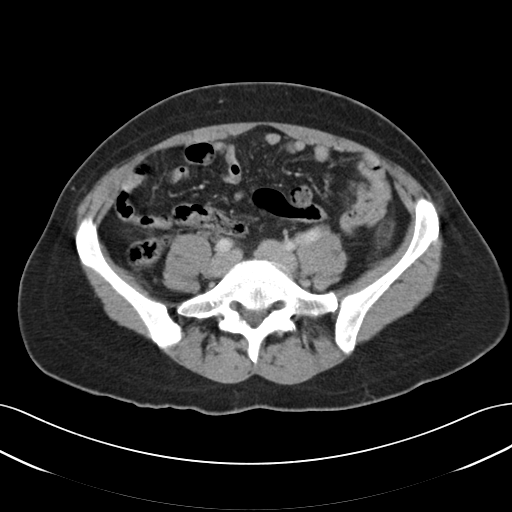
[im 40/89  soft-tissue]
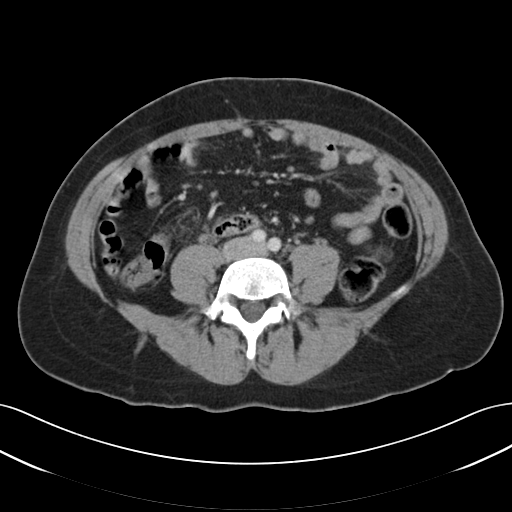
[im 49/89  soft-tissue]
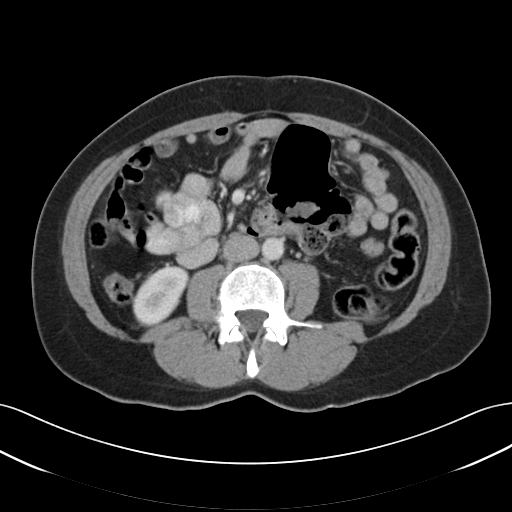
[im 54/89  soft-tissue]
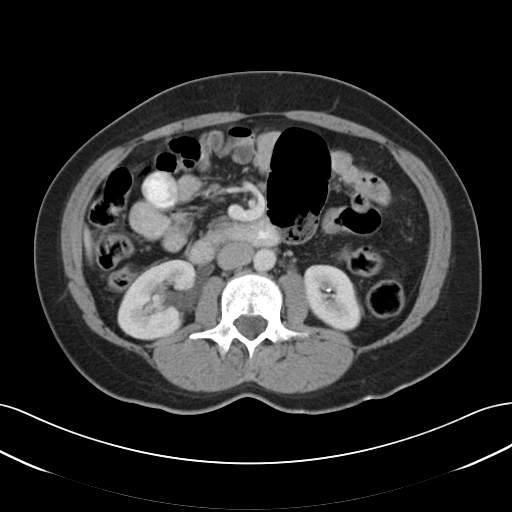
[im 64/89  soft-tissue]
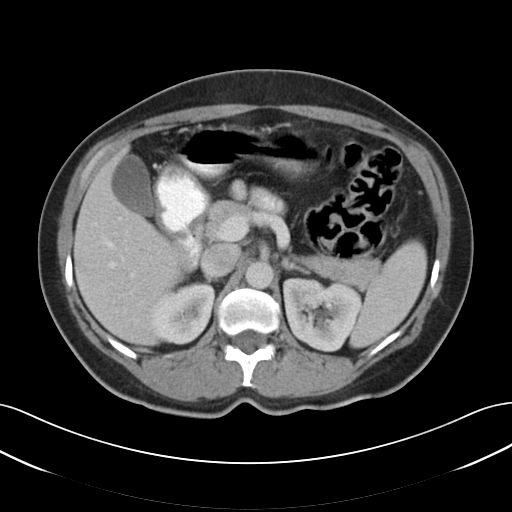
[im 64/89  bone]
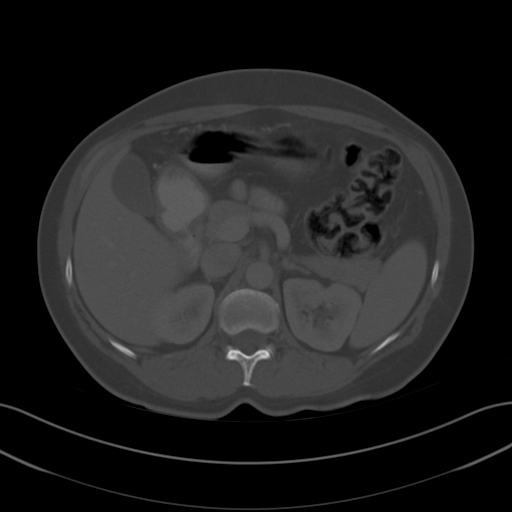
[im 69/89  soft-tissue]
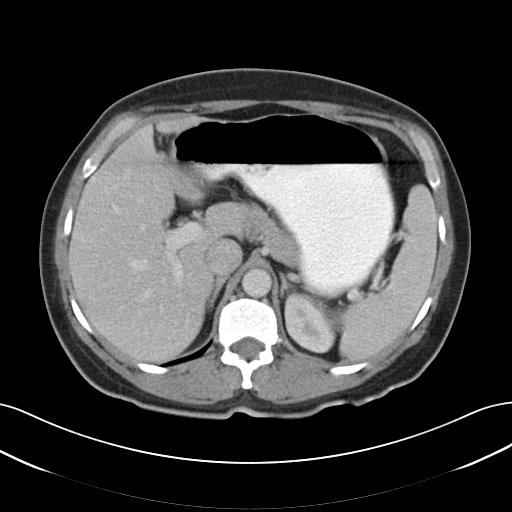
[im 69/89  lung]
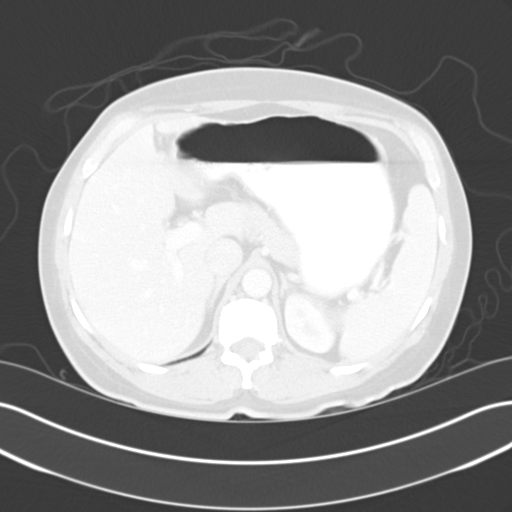
[im 74/89  soft-tissue]
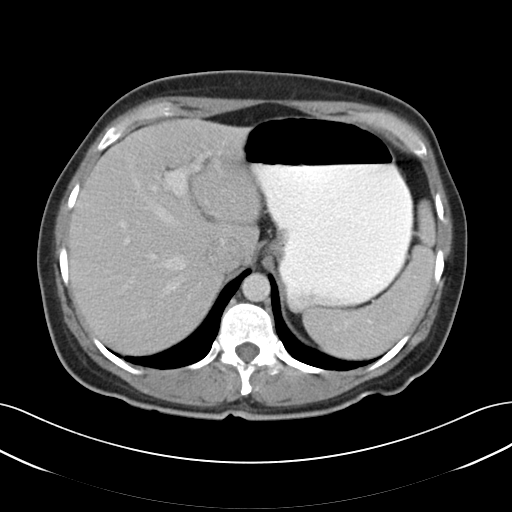
[im 74/89  lung]
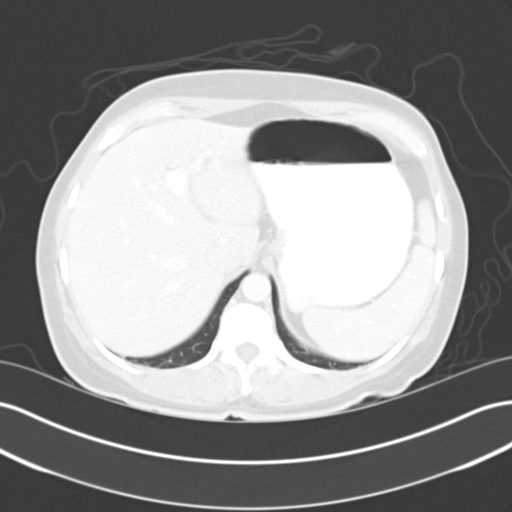
[im 79/89  lung]
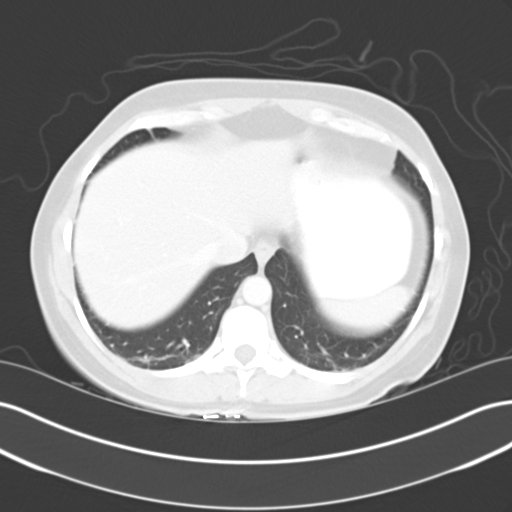
[im 84/89  soft-tissue]
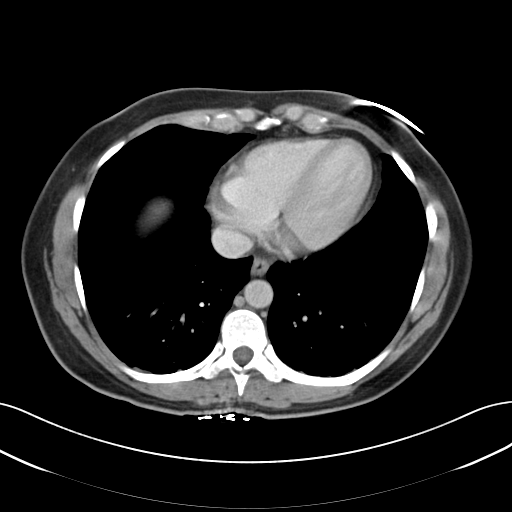
[im 84/89  lung]
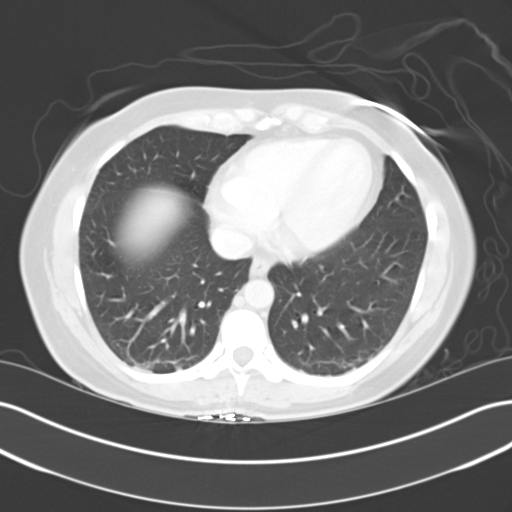

[13 of 32 positions shown; findings below may reference images not displayed]

FINDINGS: Lower chest: Dependent subsegmental atelectasis in both lower lobes.

Hepatobiliary: Unremarkable

Pancreas: Unremarkable

Spleen: Unremarkable

Adrenals/Urinary Tract: In the left kidney lower pole there is a
by 0.4 cm cluster of 2 adjacent calculi (image 73, series 6) as well
as a separate 3 mm calculus (image 80, series 6). No hydronephrosis
or hydroureter. No ureteral calculus or bladder calculus. Adrenal
glands normal.

Stomach/Bowel: Mobile cecum, positioned in the left upper quadrant,
with adjacent pericecal lymph nodes measuring up to 1 cm in short
axis (image 35, series 6) with normal appearance of the appendix, no
compelling findings of twisting/volvulus. Terminal ileum
unremarkable.

Vascular/Lymphatic: Normal sized pelvic and small retroperitoneal
lymph nodes.

Reproductive: IUD in the uterus appears well positioned.

Cystic lesion of the right ovary measuring 5.2 by 3.3 by 3.2 cm with
at least a single internal septation extending obliquely, as shown
on image 51 series 6. Left ovary normal.

Other: No supplemental non-categorized findings.

Musculoskeletal: Central disc protrusion at L4-5 with internal
calcification, extending caudad. No overt impingement. Transitional
L5 vertebra.
IMPRESSION: 1. A 5.2 cm right ovarian cyst with thin internal septation noted.
This is a potential cause for the patient's right lower quadrant
pain- dedicated pelvic sonography is recommended for further
characterization.
2. Mobile cecum is in the left upper quadrant. No definite twisting
or volvulus. Appendix appears unremarkable.
3. Left kidney lower pole nonobstructive calculi.
4. Central disc protrusion at L4-5 without impingement.
5. An IUD in the uterus appears well positioned.

## 2017-01-28 ENCOUNTER — Other Ambulatory Visit: Payer: 59

## 2017-06-30 ENCOUNTER — Other Ambulatory Visit: Payer: Self-pay | Admitting: Obstetrics and Gynecology

## 2017-06-30 DIAGNOSIS — N6489 Other specified disorders of breast: Secondary | ICD-10-CM

## 2017-08-03 ENCOUNTER — Other Ambulatory Visit: Payer: 59

## 2018-04-26 ENCOUNTER — Emergency Department: Payer: Managed Care, Other (non HMO)

## 2018-04-26 ENCOUNTER — Other Ambulatory Visit: Payer: Self-pay

## 2018-04-26 ENCOUNTER — Emergency Department
Admission: EM | Admit: 2018-04-26 | Discharge: 2018-04-27 | Disposition: A | Discharge status: Home / Self Care | Payer: Managed Care, Other (non HMO) | Attending: Emergency Medicine | Admitting: Emergency Medicine

## 2018-04-26 DIAGNOSIS — Z79899 Other long term (current) drug therapy: Secondary | ICD-10-CM | POA: Insufficient documentation

## 2018-04-26 DIAGNOSIS — N201 Calculus of ureter: Secondary | ICD-10-CM | POA: Diagnosis not present

## 2018-04-26 DIAGNOSIS — R1012 Left upper quadrant pain: Secondary | ICD-10-CM | POA: Diagnosis present

## 2018-04-26 LAB — URINALYSIS, COMPLETE (UACMP) WITH MICROSCOPIC
RBC / HPF: >50 RBC/hpf (ref 0–5)
SPECIFIC GRAVITY, URINE: 1.011 (ref 1.005–1.030)

## 2018-04-26 LAB — COMPREHENSIVE METABOLIC PANEL
ALBUMIN: 4.4 g/dL (ref 3.5–5.0)
ALT: 13 U/L (ref 0–44)
ANION GAP: 8 (ref 5–15)
AST: 21 U/L (ref 15–41)
Alkaline Phosphatase: 53 U/L (ref 38–126)
BILIRUBIN TOTAL: 1.1 mg/dL (ref 0.3–1.2)
BUN: 15 mg/dL (ref 6–20)
CHLORIDE: 106 mmol/L (ref 98–111)
CO2: 23 mmol/L (ref 22–32)
Calcium: 9 mg/dL (ref 8.9–10.3)
Creatinine, Ser: 0.84 mg/dL (ref 0.44–1.00)
GFR calc Af Amer: >60 mL/min (ref >60)
GFR calc non Af Amer: >60 mL/min (ref >60)
GLUCOSE: 157 mg/dL — AB (ref 70–99)
POTASSIUM: 3.4 mmol/L — AB (ref 3.5–5.1)
SODIUM: 137 mmol/L (ref 135–145)
TOTAL PROTEIN: 7.4 g/dL (ref 6.5–8.1)

## 2018-04-26 LAB — CBC WITH DIFFERENTIAL/PLATELET
Abs Immature Granulocytes: 0.03 10*3/uL (ref 0.00–0.07)
BASOS PCT: 1 %
Basophils Absolute: 0.1 10*3/uL (ref 0.0–0.1)
Eosinophils Absolute: 0.1 10*3/uL (ref 0.0–0.5)
Eosinophils Relative: 1 %
HCT: 42.1 % (ref 36.0–46.0)
HEMOGLOBIN: 14.1 g/dL (ref 12.0–15.0)
IMMATURE GRANULOCYTES: 0 %
LYMPHS ABS: 2.7 10*3/uL (ref 0.7–4.0)
Lymphocytes Relative: 27 %
MCH: 31.1 pg (ref 26.0–34.0)
MCHC: 33.5 g/dL (ref 30.0–36.0)
MCV: 92.7 fL (ref 80.0–100.0)
MONO ABS: 0.6 10*3/uL (ref 0.1–1.0)
MONOS PCT: 6 %
NEUTROS ABS: 6.5 10*3/uL (ref 1.7–7.7)
NEUTROS PCT: 65 %
PLATELETS: 339 10*3/uL (ref 150–400)
RBC: 4.54 MIL/uL (ref 3.87–5.11)
RDW: 12.9 % (ref 11.5–15.5)
WBC: 10 10*3/uL (ref 4.0–10.5)
nRBC: 0 % (ref 0.0–0.2)

## 2018-04-26 MED ORDER — ONDANSETRON HCL 4 MG/2ML IJ SOLN
4.0000 mg | Freq: Once | INTRAMUSCULAR | Status: AC
  Administered 2018-04-26: 4 mg via INTRAVENOUS
  Filled 2018-04-26: qty 2

## 2018-04-26 MED ORDER — FENTANYL CITRATE (PF) 100 MCG/2ML IJ SOLN
50.0000 ug | INTRAMUSCULAR | Status: DC | PRN
  Administered 2018-04-26: 50 ug via INTRAVENOUS
  Filled 2018-04-26: qty 2

## 2018-04-26 MED ORDER — SODIUM CHLORIDE 0.9 % IV BOLUS
1000.0000 mL | Freq: Once | INTRAVENOUS | Status: AC
  Administered 2018-04-26: 1000 mL via INTRAVENOUS

## 2018-04-26 MED ORDER — SODIUM CHLORIDE 0.9 % IV BOLUS
1000.0000 mL | Freq: Once | INTRAVENOUS | Status: AC
Start: 2018-04-26 — End: 2018-04-27
  Administered 2018-04-26: 1000 mL via INTRAVENOUS

## 2018-04-26 MED ORDER — MORPHINE SULFATE (PF) 4 MG/ML IV SOLN
4.0000 mg | Freq: Once | INTRAVENOUS | Status: AC
  Administered 2018-04-26: 4 mg via INTRAVENOUS
  Filled 2018-04-26: qty 1

## 2018-04-26 MED ORDER — HYDROMORPHONE HCL 1 MG/ML IJ SOLN
1.0000 mg | Freq: Once | INTRAMUSCULAR | Status: AC
  Administered 2018-04-26: 1 mg via INTRAVENOUS
  Filled 2018-04-26: qty 1

## 2018-04-26 MED ORDER — KETOROLAC TROMETHAMINE 30 MG/ML IJ SOLN
10.0000 mg | Freq: Once | INTRAMUSCULAR | Status: AC
  Administered 2018-04-27: 9.9 mg via INTRAMUSCULAR
  Filled 2018-04-26: qty 1

## 2018-04-26 NOTE — ED Provider Notes (Signed)
[facility] Emergency Department Provider Note  ____________________________________________  Time seen: Approximately 8:56 PM  I have reviewed the triage vital signs and the nursing notes.   HISTORY  Chief Complaint Flank Pain    HPI Mary Ayers is a 47 y.o. female who presents to the emergency department for treatment and evaluation of left flank pain.  Pain started approximately 1 hour prior to arrival.  She is also had some mild nausea without vomiting.  No alleviating measures attempted prior to arrival.  Patient does have a history of kidney stones with the last being approximately 2 years ago.   Past Medical History:  Diagnosis Date  . Kidney stone     Patient Active Problem List   Diagnosis Date Noted  . Cyst of right ovary 07/09/2016  . Pelvic pain in female 07/09/2016  . 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 EXTRACORPOREAL SHOCK WAVE LITHOTRIPSY Left 06/14/2015   Procedure: EXTRACORPOREAL SHOCK WAVE LITHOTRIPSY (ESWL);  Surgeon: Orson Ape, MD;  Location: [facility] ORS;  Service: Urology;  Laterality: Left;  . INTRAUTERINE DEVICE INSERTION    . RHINOPLASTY    . SINUS IRRIGATION    . TONSILLECTOMY      Prior to Admission medications   Medication Sig Start Date End Date Taking? Authorizing Provider  HYDROcodone-acetaminophen (NORCO/VICODIN) 5-325 MG tablet Take 1 tablet by mouth every 4 (four) hours as needed for moderate pain. 04/27/18 04/27/19  Burhan Barham, Rulon Eisenmenger B, FNP  NEOMYCIN-POLYMYXIN-HYDROCORTISONE (CORTISPORIN) 1 % SOLN otic solution Apply 1-2 drops to toe BID after soaking 07/09/16   Hyatt, Max T, DPM  tamsulosin (FLOMAX) 0.4 MG CAPS capsule Take 1 capsule (0.4 mg total) by mouth daily. 04/27/18   Rett Stehlik, Rulon Eisenmenger B, FNP    Allergies Naproxen  No family history on file.  Social History Social History   Tobacco Use  . Smoking status: Never Smoker  . Smokeless tobacco:  Never Used  Substance Use Topics  . Alcohol use: No  . Drug use: No    Review of Systems Constitutional: Negative for fever. Respiratory: Negative for shortness of breath or cough. Gastrointestinal: Positive for abdominal pain; positive for nausea , negative for vomiting. Genitourinary: Negative for dysuria , negative for vaginal discharge. Musculoskeletal: Positive for back pain. Skin: Negative for acute skin changes/rash/lesion. ____________________________________________   PHYSICAL EXAM:  VITAL SIGNS: ED Triage Vitals  Enc Vitals Group     BP 04/26/18 2008 (!) 175/99     Pulse Rate 04/26/18 2008 76     Resp 04/26/18 2008 20     Temp 04/26/18 2008 97.8 F (36.6 C)     Temp Source 04/26/18 2008 Oral     SpO2 04/26/18 2008 97 %     Weight 04/26/18 2009 150 lb (68 kg)     Height 04/26/18 2009 5\' 1"  (1.549 m)     Head Circumference --      Peak Flow --      Pain Score 04/26/18 2009 10     Pain Loc --      Pain Edu? --      Excl. in GC? --     Constitutional: Alert and oriented. Well appearing and in no acute distress. Eyes: Conjunctivae are normal. Head: Atraumatic. Nose: No congestion/rhinnorhea. Mouth/Throat: Mucous membranes are moist. Respiratory: Normal respiratory effort.  No retractions. Gastrointestinal: Bowel sounds active x 4; Abdomen is soft without rebound or guarding. Genitourinary: Pelvic exam: Not indicated Musculoskeletal: No  extremity tenderness nor edema.  CVA tenderness on the left Neurologic:  Normal speech and language. No gross focal neurologic deficits are appreciated. Speech is normal. No gait instability. Skin:  Skin is warm, dry and intact. No rash noted on exposed skin. Psychiatric: Mood and affect are normal. Speech and behavior are normal.  ____________________________________________   LABS (all labs ordered are listed, but only abnormal results are displayed)  Labs Reviewed  COMPREHENSIVE METABOLIC PANEL - Abnormal; Notable for  the following components:      Result Value   Potassium 3.4 (*)    Glucose, Bld 157 (*)    All other components within normal limits  URINALYSIS, COMPLETE (UACMP) WITH MICROSCOPIC - Abnormal; Notable for the following components:   Color, Urine ORANGE (*)    APPearance CLEAR (*)    Glucose, UA   (*)    Value: TEST NOT REPORTED DUE TO COLOR INTERFERENCE OF URINE PIGMENT   Hgb urine dipstick   (*)    Value: TEST NOT REPORTED DUE TO COLOR INTERFERENCE OF URINE PIGMENT   Bilirubin Urine   (*)    Value: TEST NOT REPORTED DUE TO COLOR INTERFERENCE OF URINE PIGMENT   Ketones, ur   (*)    Value: TEST NOT REPORTED DUE TO COLOR INTERFERENCE OF URINE PIGMENT   Protein, ur   (*)    Value: TEST NOT REPORTED DUE TO COLOR INTERFERENCE OF URINE PIGMENT   Nitrite   (*)    Value: TEST NOT REPORTED DUE TO COLOR INTERFERENCE OF URINE PIGMENT   Leukocytes, UA   (*)    Value: TEST NOT REPORTED DUE TO COLOR INTERFERENCE OF URINE PIGMENT   Bacteria, UA RARE (*)    All other components within normal limits  CBC WITH DIFFERENTIAL/PLATELET   ____________________________________________  RADIOLOGY  CT abdomen and pelvis reveals moderate left hydronephrosis and proximal hydroureter secondary to a 5 x 8 mm stone in the proximal left ureter about 2 cm distal to the left UPJ. ____________________________________________  Procedures  ____________________________________________  47 year old female presenting to the emergency department for treatment and evaluation of left flank pain.  CT confirms presence of kidney stone.  While here, the patient was given fentanyl, morphine, then Dilaudid and 2 L of IV fluid with successful pain control.  Patient requests to be discharged home.  Prior to discharge, she will be given Toradol.  Prescriptions for Norco and Flomax will be written and she is to call tomorrow to schedule an appointment with urology.  She has seen Dr. Sheppard Penton in the past, but was also given information  for Dr. Apolinar Junes and was advised to see whomever had the earliest appointment.  Strict ER return precautions were discussed with her and the family.  Work note was provided as well.  INITIAL IMPRESSION / ASSESSMENT AND PLAN / ED COURSE  Pertinent labs & imaging results that were available during my care of the patient were reviewed by me and considered in my medical decision making (see chart for details).  ____________________________________________   FINAL CLINICAL IMPRESSION(S) / ED DIAGNOSES  Final diagnoses:  Ureterolithiasis    Note:  This document was prepared using Dragon voice recognition software and may include unintentional dictation errors.    Chinita Pester, FNP 04/27/18 0022    Myrna Blazer, MD 04/29/18 [phone]

## 2018-04-26 NOTE — ED Triage Notes (Signed)
PT in with co acute onset of left flank pain that started 1 hr pta, hx of kidney stones.

## 2018-04-27 ENCOUNTER — Other Ambulatory Visit: Payer: Self-pay | Admitting: Radiology

## 2018-04-27 ENCOUNTER — Ambulatory Visit: Payer: Managed Care, Other (non HMO) | Admitting: Urology

## 2018-04-27 ENCOUNTER — Other Ambulatory Visit: Payer: Self-pay

## 2018-04-27 ENCOUNTER — Encounter: Payer: Self-pay | Admitting: Urology

## 2018-04-27 VITALS — BP 118/64 | HR 60 | Wt 150.0 lb

## 2018-04-27 DIAGNOSIS — N200 Calculus of kidney: Secondary | ICD-10-CM

## 2018-04-27 MED ORDER — HYDROCODONE-ACETAMINOPHEN 5-325 MG PO TABS: 1.0000 | ORAL_TABLET | ORAL | 0 refills | Status: DC | PRN

## 2018-04-27 MED ORDER — HYDROCODONE-ACETAMINOPHEN 5-325 MG PO TABS: 1.0000 | ORAL_TABLET | Freq: Four times a day (QID) | ORAL | 0 refills | Status: DC | PRN

## 2018-04-27 MED ORDER — TAMSULOSIN HCL 0.4 MG PO CAPS: 0.4000 mg | ORAL_CAPSULE | Freq: Every day | ORAL | 0 refills | Status: DC

## 2018-04-27 NOTE — Discharge Instructions (Addendum)
Please call the urologist tomorrow to schedule an appointment.  Return to the emergency department immediately for symptoms that change or worsen if you are unable to schedule an appointment with primary care or the urologist.

## 2018-04-27 NOTE — Progress Notes (Signed)
04/27/2018 5:13 PM   Mary Ayers Nov 07, 1970 [id number]  Referring provider: Danella Penton, MD [phone] [facility] [address] [facility] [place], [place] [address]  CC: Left-sided flank pain  HPI: I saw Mary Ayers today in urology clinic in consultation for left-sided flank pain from Dr. Hyacinth Meeker.  She is a healthy 47 year old female who works for [employer] who presented to the emergency department last night with acute onset of severe left renal colic with a sharp pain and CT demonstrating an 8 mm left proximal ureteral stone with upstream hydronephrosis.  She denies any fevers or chills.  Her pain is moderately well controlled with narcotics.  She is not taking any NSAIDs.  She previously underwent successful left-sided shockwave lithotripsy with Dr. Sheppard Penton in 2016.  There are no aggravating factors.  Urinalysis in the emergency room last night showed rare bacteria, 0-5 WBCs, greater than 50 RBCs, nitrite negative.  Renal function was normal.  She has been taking Pyridium.   PMH: Past Medical History:  Diagnosis Date  . Kidney stone     Surgical History: Past Surgical History:  Procedure Laterality Date  . EXTRACORPOREAL SHOCK WAVE LITHOTRIPSY Left 06/14/2015   Procedure: EXTRACORPOREAL SHOCK WAVE LITHOTRIPSY (ESWL);  Surgeon: Orson Ape, MD;  Location: [facility] ORS;  Service: Urology;  Laterality: Left;  . INTRAUTERINE DEVICE INSERTION    . RHINOPLASTY    . SINUS IRRIGATION    . TONSILLECTOMY      Allergies:  Allergies  Allergen Reactions  . Naproxen Rash    Family History: History reviewed. No pertinent family history.  Social History:  reports that she has never smoked. She has never used smokeless tobacco. She reports that she does not drink alcohol or use drugs.  ROS: Please see flowsheet from today's date for complete review of systems.  Physical Exam: BP 118/64   Pulse 60   Wt 150 lb (68 kg)   LMP 04/12/2018   BMI 28.34 kg/m     Constitutional:  Alert and oriented, appears uncomfortable Cardiovascular: No clubbing, cyanosis, or edema. Respiratory: Normal respiratory effort, no increased work of breathing. GI: Abdomen is soft, nontender, nondistended, no abdominal masses GU: Left CVA tenderness Lymph: No cervical or inguinal lymphadenopathy. Skin: No rashes, bruises or suspicious lesions. Neurologic: Grossly intact, no focal deficits, moving all 4 extremities. Psychiatric: Normal mood and affect.  Laboratory Data: Reviewed  Pertinent Imaging: I have personally reviewed the CT stone protocol dated 04/26/2018.  Left 8 mm proximal ureteral stone, additional small left intrarenal stones. 700HU, 10cm SSD.  Assessment & Plan:   In summary, Mary Ayers is a very healthy 47 year old female with prior history nephrolithiasis who is here today with 12 hours of severe left-sided renal colic secondary to an 8 mm left proximal ureteral stone.  She has no clinical or laboratory signs of infection.  We discussed various treatment options for urolithiasis including observation with or without medical expulsive therapy, shockwave lithotripsy (SWL), ureteroscopy and laser lithotripsy with stent placement, and percutaneous nephrolithotomy.  We discussed that management is based on stone size, location, density, patient co-morbidities, and patient preference.   Stones <41mm in size have a >80% spontaneous passage rate. Data surrounding the use of tamsulosin for medical expulsive therapy is controversial, but meta analyses suggests it is most efficacious for distal stones between 5-23mm in size. Possible side effects include dizziness/lightheadedness, and retrograde ejaculation.  SWL has a lower stone free rate in a single procedure, but also a lower complication rate  compared to ureteroscopy and avoids a stent and associated stent related symptoms. Possible complications include renal hematoma, steinstrasse, and need for additional  treatment.  Ureteroscopy with laser lithotripsy and stent placement has a higher stone free rate than SWL in a single procedure, however increased complication rate including possible infection, ureteral injury, bleeding, and stent related morbidity. Common stent related symptoms include dysuria, urgency/frequency, and flank pain.  After an extensive discussion of the risks and benefits of the above treatment options, the patient would like to proceed with LEFT SWL.  SWL Thursday 11/14 24-hour urine when stone treatment completed and discuss results for stone prevention  Sondra ComeBrian C Caleb Decock, MD  [facility] [address], [address] [place], [place] [address] [phone]

## 2018-04-28 LAB — MICROSCOPIC EXAMINATION

## 2018-04-28 LAB — URINALYSIS, COMPLETE
Bilirubin, UA: NEGATIVE
Glucose, UA: NEGATIVE
Ketones, UA: NEGATIVE
LEUKOCYTES UA: NEGATIVE
Nitrite, UA: POSITIVE — AB
PROTEIN UA: NEGATIVE
Specific Gravity, UA: 1.025 (ref 1.005–1.030)
UUROB: 0.2 mg/dL (ref 0.2–1.0)
pH, UA: 5 (ref 5.0–7.5)

## 2018-04-29 ENCOUNTER — Encounter: Payer: Self-pay | Admitting: *Deleted

## 2018-04-29 ENCOUNTER — Encounter
Admission: RE | Disposition: A | Discharge status: Home / Self Care | Payer: Self-pay | Source: Ambulatory Visit | Attending: Urology

## 2018-04-29 ENCOUNTER — Other Ambulatory Visit: Payer: Self-pay

## 2018-04-29 ENCOUNTER — Ambulatory Visit
Admission: RE | Admit: 2018-04-29 | Discharge: 2018-04-29 | Disposition: A | Discharge status: Home / Self Care | Payer: Managed Care, Other (non HMO) | Source: Ambulatory Visit | Attending: Urology | Admitting: Urology

## 2018-04-29 ENCOUNTER — Ambulatory Visit: Payer: Managed Care, Other (non HMO)

## 2018-04-29 DIAGNOSIS — N201 Calculus of ureter: Secondary | ICD-10-CM

## 2018-04-29 DIAGNOSIS — N200 Calculus of kidney: Secondary | ICD-10-CM

## 2018-04-29 HISTORY — PX: EXTRACORPOREAL SHOCK WAVE LITHOTRIPSY: SHX1557

## 2018-04-29 HISTORY — DX: Personal history of urinary calculi: Z87.442

## 2018-04-29 LAB — POCT PREGNANCY, URINE: Preg Test, Ur: NEGATIVE

## 2018-04-29 SURGERY — LITHOTRIPSY, ESWL
Anesthesia: Moderate Sedation | Laterality: Left

## 2018-04-29 MED ORDER — ONDANSETRON HCL 4 MG/2ML IJ SOLN
INTRAMUSCULAR | Status: AC
  Filled 2018-04-29: qty 2

## 2018-04-29 MED ORDER — CIPROFLOXACIN HCL 500 MG PO TABS
ORAL_TABLET | ORAL | Status: AC
  Filled 2018-04-29: qty 1

## 2018-04-29 MED ORDER — ONDANSETRON HCL 4 MG PO TABS: 4.0000 mg | ORAL_TABLET | Freq: Every day | ORAL | 1 refills | Status: AC | PRN

## 2018-04-29 MED ORDER — ONDANSETRON HCL 4 MG/2ML IJ SOLN: 4.0000 mg | Freq: Once | INTRAMUSCULAR | Status: DC

## 2018-04-29 MED ORDER — DIPHENHYDRAMINE HCL 25 MG PO CAPS
ORAL_CAPSULE | ORAL | Status: AC
  Filled 2018-04-29: qty 1

## 2018-04-29 MED ORDER — DIAZEPAM 5 MG PO TABS
10.0000 mg | ORAL_TABLET | ORAL | Status: AC
  Administered 2018-04-29: 10 mg via ORAL

## 2018-04-29 MED ORDER — HYDROCODONE-ACETAMINOPHEN 5-325 MG PO TABS
1.0000 | ORAL_TABLET | ORAL | Status: DC | PRN
  Administered 2018-04-29: 1 via ORAL

## 2018-04-29 MED ORDER — ONDANSETRON HCL 2 MG/ML IV SOLN
4.0000 mg | Freq: Once | INTRAVENOUS | Status: DC
  Administered 2018-04-29: 4 mg via INTRAVENOUS

## 2018-04-29 MED ORDER — CIPROFLOXACIN HCL 500 MG PO TABS
500.0000 mg | ORAL_TABLET | ORAL | Status: AC
  Administered 2018-04-29: 500 mg via ORAL

## 2018-04-29 MED ORDER — HYDROCODONE-ACETAMINOPHEN 5-325 MG PO TABS
ORAL_TABLET | ORAL | Status: AC
  Administered 2018-04-29: 1 via ORAL
  Filled 2018-04-29: qty 1

## 2018-04-29 MED ORDER — DIPHENHYDRAMINE HCL 25 MG PO CAPS
25.0000 mg | ORAL_CAPSULE | ORAL | Status: AC
  Administered 2018-04-29: 25 mg via ORAL

## 2018-04-29 MED ORDER — SODIUM CHLORIDE 0.9 % IV SOLN
INTRAVENOUS | Status: DC
  Administered 2018-04-29: 10:00:00 via INTRAVENOUS

## 2018-04-29 MED ORDER — DIAZEPAM 5 MG PO TABS
ORAL_TABLET | ORAL | Status: AC
  Filled 2018-04-29: qty 2

## 2018-04-29 MED FILL — Ondansetron HCl Inj 4 MG/2ML (2 MG/ML): INTRAMUSCULAR | Qty: 2 | Status: AC

## 2018-04-29 NOTE — Discharge Instructions (Signed)
AMBULATORY SURGERY  °DISCHARGE INSTRUCTIONS ° ° °1) The drugs that you were given will stay in your system until tomorrow so for the next 24 hours you should not: ° °A) Drive an automobile °B) Make any legal decisions °C) Drink any alcoholic beverage ° ° °2) You may resume regular meals tomorrow.  Today it is better to start with liquids and gradually work up to solid foods. ° °You may eat anything you prefer, but it is better to start with liquids, then soup and crackers, and gradually work up to solid foods. ° ° °3) Please notify your doctor immediately if you have any unusual bleeding, trouble breathing, redness and pain at the surgery site, drainage, fever, or pain not relieved by medication. ° ° ° °4) Additional Instructions: ° ° ° ° ° ° ° °Please contact your physician with any problems or Same Day Surgery at [phone], Monday through Friday 6 am to 4 pm, or [place] [facility] [place] Main number at [phone]. °

## 2018-04-29 NOTE — OR Nursing (Signed)
Discharge instructions discussed with pt and husband. Both voice understanding. 

## 2018-04-29 NOTE — Progress Notes (Signed)
SWL POST-OP NOTE  LEFT 8mm proximal ureteral stone, 700HU, 10cm SSD  4000 shocks given, patient tolerated well, stone appeared to have excellent fragmentation.  Follow up in 2 weeks for KUB 24 hour urine collection in 2-3 weeks   Mary RamsBrian Arlyn Buerkle, MD 04/29/2018

## 2018-04-29 NOTE — H&P (Signed)
UROLOGY H&P UPDATE  Agree with prior H&P dated 04/27/2018  Cardiac: RRR Lungs: CTA bilaterally  Laterality: LEFT  Procedure: LEFT SWL  LEFT 8mm stone, 700HU, 10cm SSD  Urinalysis: 11/11 rare bacteria, 0-5 WBCs, greater than 50 RBCs, nitrite negative  Informed consent obtained, we specifically discussed the risk of bleeding including renal hematoma, infection, steinstrasse, and possible need for additional procedures.  Sondra ComeBrian C Sninsky, MD 04/29/2018

## 2018-04-30 ENCOUNTER — Encounter: Payer: Self-pay | Admitting: Urology

## 2018-05-18 ENCOUNTER — Ambulatory Visit (INDEPENDENT_AMBULATORY_CARE_PROVIDER_SITE_OTHER): Payer: Managed Care, Other (non HMO) | Admitting: Urology

## 2018-05-18 ENCOUNTER — Encounter: Payer: Self-pay | Admitting: Urology

## 2018-05-18 ENCOUNTER — Ambulatory Visit
Admission: RE | Admit: 2018-05-18 | Discharge: 2018-05-18 | Disposition: A | Discharge status: Home / Self Care | Payer: Managed Care, Other (non HMO) | Source: Ambulatory Visit | Attending: Urology | Admitting: Urology

## 2018-05-18 ENCOUNTER — Other Ambulatory Visit: Payer: Self-pay

## 2018-05-18 ENCOUNTER — Telehealth: Payer: Self-pay

## 2018-05-18 VITALS — BP 138/90 | HR 90 | Ht 61.0 in | Wt 150.0 lb

## 2018-05-18 DIAGNOSIS — N200 Calculus of kidney: Secondary | ICD-10-CM | POA: Insufficient documentation

## 2018-05-18 DIAGNOSIS — Z975 Presence of (intrauterine) contraceptive device: Secondary | ICD-10-CM | POA: Insufficient documentation

## 2018-05-18 NOTE — Progress Notes (Signed)
   05/18/2018 8:57 AM   [name] 30-Dec-1970 [id number]  Reason for visit: Follow up L SWL  HPI: I saw Mary Ayers in urology clinic today in follow-up after shockwave lithotripsy.  She is a healthy 47 year old female who works at [employer] who underwent left shockwave lithotripsy on 04/29/2017 for a left 8 mm proximal ureteral stone.  She denies any gross hematuria, flank pain, or fevers since the procedure.  She did pass numerous fragments which she brought in today.  She has some small residual left lower pole stones seen on KUB, but no ureteral fragments.  She is interested in further work-up for stone prevention including 24-hour urine collection.   ROS: Please see flowsheet from today's date for complete review of systems.  Physical Exam: BP 138/90 (BP Location: Left Arm, Patient Position: Sitting, Cuff Size: Normal)   Pulse 90   Ht 5\' 1"  (1.549 m)   Wt 150 lb (68 kg)   BMI 28.34 kg/m    In summary, Ms. back is a healthy 47 year old female status post successful left shockwave lithotripsy for an 8 mm proximal ureteral stone.  She is completely asymptomatic at this time.  We discussed general stone prevention strategies including adequate hydration with goal of producing 2.5 L of urine daily, increasing citric acid intake, increasing calcium intake during high oxalate meals, minimizing animal protein, and decreasing salt intake. Information about dietary recommendations given today.   24 hour urine collection Stone for analysis Follow-up in 5 to 6 weeks to discuss above results  A total of 10 minutes were spent face-to-face with the patient, greater than 50% was spent in patient education, counseling, and coordination of care regarding nephrolithiasis, and stone prevention.  Sondra ComeBrian C Caylyn Tedeschi, MD  [facility] [address], [address] [place], [place] [address] [phone]

## 2018-05-18 NOTE — Telephone Encounter (Signed)
Per Dr Richardo HanksSninsky, Litholink order was faxed. Instructions were expressed to pt, with verbal understanding.

## 2018-05-20 ENCOUNTER — Other Ambulatory Visit: Payer: Self-pay | Admitting: Urology

## 2018-05-25 ENCOUNTER — Other Ambulatory Visit: Payer: Managed Care, Other (non HMO)

## 2018-06-01 ENCOUNTER — Other Ambulatory Visit: Payer: Self-pay | Admitting: Urology

## 2018-06-21 ENCOUNTER — Encounter: Payer: Self-pay | Admitting: Urology

## 2018-06-21 ENCOUNTER — Ambulatory Visit: Payer: Managed Care, Other (non HMO) | Admitting: Urology

## 2018-06-21 VITALS — BP 136/83 | HR 64 | Ht 61.0 in | Wt 146.0 lb

## 2018-06-21 DIAGNOSIS — N200 Calculus of kidney: Secondary | ICD-10-CM | POA: Diagnosis not present

## 2018-06-21 NOTE — Patient Instructions (Signed)
[facility] Instructions [facility]  You will receive a box/kit in the mail that will have a urine jug and instructions in the kit.  When the box arrives you will need to call our office [phone] to schedule a LAB appointment.  You will need to do a 24hour urine and this should be done during the days that our office will be open.  For example any day from Sunday through Thursday.  How to collect the urine sample: On the day you start the urine sample this 1st morning urine should NOT be collected.  For the rest of the day including all night urines should be collected.  On the next morning the 1st urine should be collected and then you will be finished with the urine collections.  You will need to bring the box with you on your LAB appointment day after urine has been collected and all instructions are complete in the box.  Your blood will be drawn and the box will be collected by our Lab employee to be sent off for analysis.  When urine and blood is complete you will need to schedule a follow up appointment for lab results.

## 2018-06-21 NOTE — Progress Notes (Signed)
   06/21/2018 9:43 AM   Mary Ayers [birth year] [id number]  Reason for visit: Discuss 24-hour urine results  HPI: I saw [name] back in urology clinic to discuss her 24-hour urine results.  She is a healthy 48 year old female who works at [employer] who underwent successful left-sided shockwave lithotripsy on 04/29/2018.  She has a long history of kidney stones.  We reviewed her Litholink results.  These were notable for a low urine volume of 1.92 L, elevated supersaturation of calcium oxalate of 9.94, elevated urine calcium of 229, elevated urine oxalate of 46, and borderline elevated urine sodium of 147.  Favorable findings were citrate of 991.  We discussed general stone prevention strategies including adequate hydration with goal of producing 2.5 L of urine daily, increasing citric acid intake, increasing calcium intake during high oxalate meals, minimizing animal protein, and decreasing salt intake. Information about dietary recommendations given today.   Her current stone burden is none on the right, very small left lower pole stones.  1. Increase urine volume >2.5 L/day 2. Minimize salt 3. Offered indapamide for hypercalciuria but patient deferred  RTC 6 months for repeat 24-hour urine and KUB Consider addition of indapamide if persistent hypercalciuria  A total of 15 minutes were spent face-to-face with the patient, greater than 50% was spent in patient education, counseling, and coordination of care regarding 24-hour urine results, stone prevention, and need for close surveillance.  Sondra Come, MD  [facility] [address] [place], [place] [address] [phone]

## 2018-07-25 ENCOUNTER — Other Ambulatory Visit: Payer: Self-pay

## 2018-07-25 ENCOUNTER — Encounter (HOSPITAL_COMMUNITY): Payer: Self-pay | Admitting: Emergency Medicine

## 2018-07-25 ENCOUNTER — Emergency Department (HOSPITAL_COMMUNITY)
Admission: EM | Admit: 2018-07-25 | Discharge: 2018-07-25 | Disposition: A | Discharge status: Home / Self Care | Payer: Managed Care, Other (non HMO) | Attending: Emergency Medicine | Admitting: Emergency Medicine

## 2018-07-25 DIAGNOSIS — S31825A Open bite of left buttock, initial encounter: Secondary | ICD-10-CM | POA: Diagnosis present

## 2018-07-25 DIAGNOSIS — Z79899 Other long term (current) drug therapy: Secondary | ICD-10-CM | POA: Insufficient documentation

## 2018-07-25 DIAGNOSIS — Y998 Other external cause status: Secondary | ICD-10-CM | POA: Insufficient documentation

## 2018-07-25 DIAGNOSIS — W540XXA Bitten by dog, initial encounter: Secondary | ICD-10-CM | POA: Diagnosis not present

## 2018-07-25 DIAGNOSIS — Y929 Unspecified place or not applicable: Secondary | ICD-10-CM | POA: Diagnosis not present

## 2018-07-25 DIAGNOSIS — Y9302 Activity, running: Secondary | ICD-10-CM | POA: Insufficient documentation

## 2018-07-25 MED ORDER — BACITRACIN ZINC 500 UNIT/GM EX OINT: 1.0000 "application " | TOPICAL_OINTMENT | Freq: Two times a day (BID) | CUTANEOUS | 0 refills | Status: DC

## 2018-07-25 MED ORDER — BACITRACIN ZINC 500 UNIT/GM EX OINT
TOPICAL_OINTMENT | Freq: Once | CUTANEOUS | Status: AC
  Administered 2018-07-25: 1 via TOPICAL

## 2018-07-25 MED ORDER — AMOXICILLIN-POT CLAVULANATE 875-125 MG PO TABS: 1.0000 | ORAL_TABLET | Freq: Two times a day (BID) | ORAL | 0 refills | Status: DC

## 2018-07-25 NOTE — Discharge Instructions (Signed)
It was my pleasure taking care of you today!   Please take all of your antibiotics until finished! This is to prevent infection.   Keep your wounds clean and dry.  Apply topical antibiotic ointment twice daily.  Monitor for signs of infection such as worsening swelling/pain, redness around the site, pus draining from the wounds for fevers.  Return to the emergency department is any of these symptoms occur,  new or worsening symptoms develop or you have any additional concerns.

## 2018-07-25 NOTE — ED Notes (Signed)
Irrigate wound and apply non stick dressing

## 2018-07-25 NOTE — ED Notes (Signed)
Reviewed d/c instructions with pt, who verbalized understanding and had no outstanding questions. Pt departed in NAD, refused use of wheelchair.   

## 2018-07-25 NOTE — ED Triage Notes (Signed)
Pt reports she was running down the road and states a german shepherd bit her in the left rear buttocks. Pt reports pain in her left buttocks where she was bitten. There are penetrating bite marks to the left. Pt reports dog is up to date on rabies shot.

## 2018-07-25 NOTE — ED Provider Notes (Signed)
[facility] EMERGENCY DEPARTMENT Provider Note   CSN: [id number] Arrival date & time: 07/25/18  1825     History   Chief Complaint Chief Complaint  Patient presents with  . Animal Bite    HPI Mary Ayers is a 48 y.o. female.  The history is provided by the patient and medical records. No language interpreter was used.  Animal Bite  Associated symptoms: no fever and no numbness    Mary Ayers is a 48 y.o. female  who presents to the Emergency Department complaining of dog bite to the left buttocks just prior to arrival. Patient states that she was running when a german shepherd ran up and bit her. Per patient, the dog is up-to-date on all vaccines including rabies. Tetanus up-to-date.  Medications or treatments prior to arrival for symptoms.  Animal control has been notified.  No numbness, weakness.  No difficulty with range of motion or ambulation.  Past Medical History:  Diagnosis Date  . History of kidney stones   . Kidney stone     Patient Active Problem List   Diagnosis Date Noted  . IUD (intrauterine device) in place 05/18/2018  . Cyst of right ovary 07/09/2016  . Pelvic pain in female 07/09/2016  . 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 EXTRACORPOREAL SHOCK WAVE LITHOTRIPSY Left 06/14/2015   Procedure: EXTRACORPOREAL SHOCK WAVE LITHOTRIPSY (ESWL);  Surgeon: Orson Ape, MD;  Location: [facility] ORS;  Service: Urology;  Laterality: Left;  . EXTRACORPOREAL SHOCK WAVE LITHOTRIPSY Left 04/29/2018   Procedure: EXTRACORPOREAL SHOCK WAVE LITHOTRIPSY (ESWL);  Surgeon: Sondra Come, MD;  Location: [facility] ORS;  Service: Urology;  Laterality: Left;  . INTRAUTERINE DEVICE INSERTION    . RHINOPLASTY    . SINUS IRRIGATION    . TONSILLECTOMY       OB History   No obstetric history on file.      Home Medications    Prior to Admission medications   Medication Sig Start Date End Date Taking?  Authorizing Provider  amoxicillin-clavulanate (AUGMENTIN) 875-125 MG tablet Take 1 tablet by mouth every 12 (twelve) hours. 07/25/18   Cosimo Schertzer, Chase Picket, PA-C  bacitracin ointment Apply 1 application topically 2 (two) times daily. 07/25/18   Wilmore Holsomback, Chase Picket, PA-C  Multiple Vitamins-Minerals (MULTIVITAMIN ADULT) TABS Take by mouth.    [provider]    Family History History reviewed. No pertinent family history.  Social History Social History   Tobacco Use  . Smoking status: Never Smoker  . Smokeless tobacco: Never Used  Substance Use Topics  . Alcohol use: No  . Drug use: No     Allergies   Naproxen   Review of Systems Review of Systems  Constitutional: Negative for fever.  Musculoskeletal: Positive for myalgias.  Skin: Positive for wound.  Neurological: Negative for weakness and numbness.     Physical Exam Updated Vital Signs BP (!) 141/97 (BP Location: Right Arm)   Pulse 77   Temp 98.7 F (37.1 C) (Oral)   Resp 16   Ht 5\' 1"  (1.549 m)   Wt 66.7 kg   SpO2 100%   BMI 27.78 kg/m   Physical Exam Vitals signs and nursing note reviewed.  Constitutional:      General: She is not in acute distress.    Appearance: She is well-developed.  HENT:     Head: Normocephalic and atraumatic.  Neck:     Musculoskeletal: Neck supple.  Cardiovascular:     Rate and Rhythm: Normal rate and regular rhythm.     Heart sounds: Normal heart sounds. No murmur.  Pulmonary:     Effort: Pulmonary effort is normal. No respiratory distress.     Breath sounds: Normal breath sounds.  Musculoskeletal:     Comments: Full range of motion and 5/5 muscle strength to the left lower extremity.  Skin:    General: Skin is warm and dry.     Comments: Superficial wounds to the left buttocks.  Does have one puncture-like wound, although bottom of the wound was seen and does not appear very deep.  Minimal swelling.  No surrounding erythema.  Neurological:     Mental Status: She is  alert and oriented to person, place, and time.     Comments: Bilateral lower extremities neurovascularly intact.      ED Treatments / Results  Labs (all labs ordered are listed, but only abnormal results are displayed) Labs Reviewed - No data to display  EKG None  Radiology No results found.  Procedures Procedures (including critical care time)  Medications Ordered in ED Medications  bacitracin ointment (1 application Topical Given 07/25/18 1940)     Initial Impression / Assessment and Plan / ED Course  I have reviewed the triage vital signs and the nursing notes.  Pertinent labs & imaging results that were available during my care of the patient were reviewed by me and considered in my medical decision making (see chart for details).    Mary Ayers is a 48 y.o. female who presents to ED for dog bite to the left buttocks just prior to arrival.  Her tetanus is up-to-date.  Animal's vaccines are up-to-date.  Animal control was notified by patient.  Wounds not requiring repair.  Neurovascularly intact with full range of motion and 5/5 strength.  Thoroughly irrigated in the emergency department today. Will start on prophylactic antibiotics.  Home wound care instructions, return precautions and follow-up care discussed.  All questions answered.    Final Clinical Impressions(s) / ED Diagnoses   Final diagnoses:  Dog bite, initial encounter    ED Discharge Orders         Ordered    amoxicillin-clavulanate (AUGMENTIN) 875-125 MG tablet  Every 12 hours     07/25/18 2022    bacitracin ointment  2 times daily     07/25/18 2022           Shawan Tosh, Chase PicketJaime Pilcher, PA-C 07/25/18 2026    Eber HongMiller, Brian, MD 07/28/18 [phone]

## 2018-12-27 ENCOUNTER — Ambulatory Visit
Admission: RE | Admit: 2018-12-27 | Discharge: 2018-12-27 | Disposition: A | Discharge status: Home / Self Care | Payer: Managed Care, Other (non HMO) | Attending: Urology | Admitting: Urology

## 2018-12-27 ENCOUNTER — Other Ambulatory Visit: Payer: Self-pay

## 2018-12-27 ENCOUNTER — Telehealth: Payer: Self-pay

## 2018-12-27 ENCOUNTER — Ambulatory Visit
Admission: RE | Admit: 2018-12-27 | Discharge: 2018-12-27 | Disposition: A | Discharge status: Home / Self Care | Payer: Managed Care, Other (non HMO) | Source: Ambulatory Visit | Attending: Urology | Admitting: Urology

## 2018-12-27 ENCOUNTER — Ambulatory Visit: Payer: Managed Care, Other (non HMO) | Admitting: Urology

## 2018-12-27 ENCOUNTER — Encounter: Payer: Self-pay | Admitting: Urology

## 2018-12-27 VITALS — BP 144/87 | HR 71 | Ht 61.0 in | Wt 153.0 lb

## 2018-12-27 DIAGNOSIS — N200 Calculus of kidney: Secondary | ICD-10-CM | POA: Insufficient documentation

## 2018-12-27 NOTE — Patient Instructions (Signed)
Dietary Guidelines to Help Prevent Kidney Stones Kidney stones are deposits of minerals and salts that form inside your kidneys. Your risk of developing kidney stones may be greater depending on your diet, your lifestyle, the medicines you take, and whether you have certain medical conditions. Most people can reduce their chances of developing kidney stones by following the instructions below. Depending on your overall health and the type of kidney stones you tend to develop, your dietitian may give you more specific instructions. What are tips for following this plan? Reading food labels  Choose foods with "no salt added" or "low-salt" labels. Limit your sodium intake to less than 1500 mg per day.  Choose foods with calcium for each meal and snack. Try to eat about 300 mg of calcium at each meal. Foods that contain 200-500 mg of calcium per serving include: ? 8 oz (237 ml) of milk, fortified nondairy milk, and fortified fruit juice. ? 8 oz (237 ml) of kefir, yogurt, and soy yogurt. ? 4 oz (118 ml) of tofu. ? 1 oz of cheese. ? 1 cup (300 g) of dried figs. ? 1 cup (91 g) of cooked broccoli. ? 1-3 oz can of sardines or mackerel.  Most people need 1000 to 1500 mg of calcium each day. Talk to your dietitian about how much calcium is recommended for you. Shopping  Buy plenty of fresh fruits and vegetables. Most people do not need to avoid fruits and vegetables, even if they contain nutrients that may contribute to kidney stones.  When shopping for convenience foods, choose: ? Whole pieces of fruit. ? Premade salads with dressing on the side. ? Low-fat fruit and yogurt smoothies.  Avoid buying frozen meals or prepared deli foods.  Look for foods with live cultures, such as yogurt and kefir. Cooking  Do not add salt to food when cooking. Place a salt shaker on the table and allow each person to add his or her own salt to taste.  Use vegetable protein, such as beans, textured vegetable  protein (TVP), or tofu instead of meat in pasta, casseroles, and soups. Meal planning   Eat less salt, if told by your dietitian. To do this: ? Avoid eating processed or premade food. ? Avoid eating fast food.  Eat less animal protein, including cheese, meat, poultry, or fish, if told by your dietitian. To do this: ? Limit the number of times you have meat, poultry, fish, or cheese each week. Eat a diet free of meat at least 2 days a week. ? Eat only one serving each day of meat, poultry, fish, or seafood. ? When you prepare animal protein, cut pieces into small portion sizes. For most meat and fish, one serving is about the size of one deck of cards.  Eat at least 5 servings of fresh fruits and vegetables each day. To do this: ? Keep fruits and vegetables on hand for snacks. ? Eat 1 piece of fruit or a handful of berries with breakfast. ? Have a salad and fruit at lunch. ? Have two kinds of vegetables at dinner.  Limit foods that are high in a substance called oxalate. These include: ? Spinach. ? Rhubarb. ? Beets. ? Potato chips and french fries. ? Nuts.  If you regularly take a diuretic medicine, make sure to eat at least 1-2 fruits or vegetables high in potassium each day. These include: ? Avocado. ? Banana. ? Orange, prune, carrot, or tomato juice. ? Baked potato. ? Cabbage. ? Beans and split   peas. General instructions   Drink enough fluid to keep your urine clear or pale yellow. This is the most important thing you can do.  Talk to your health care provider and dietitian about taking daily supplements. Depending on your health and the cause of your kidney stones, you may be advised: ? Not to take supplements with vitamin C. ? To take a calcium supplement. ? To take a daily probiotic supplement. ? To take other supplements such as magnesium, fish oil, or vitamin B6.  Take all medicines and supplements as told by your health care provider.  Limit alcohol intake to no  more than 1 drink a day for nonpregnant women and 2 drinks a day for men. One drink equals 12 oz of beer, 5 oz of wine, or 1 oz of hard liquor.  Lose weight if told by your health care provider. Work with your dietitian to find strategies and an eating plan that works best for you. What foods are not recommended? Limit your intake of the following foods, or as told by your dietitian. Talk to your dietitian about specific foods you should avoid based on the type of kidney stones and your overall health. Grains Breads. Bagels. Rolls. Baked goods. Salted crackers. Cereal. Pasta. Vegetables Spinach. Rhubarb. Beets. Canned vegetables. Mary Ayers. Olives. Meats and other protein foods Nuts. Nut butters. Large portions of meat, poultry, or fish. Salted or cured meats. Deli meats. Hot dogs. Sausages. Dairy Cheese. Beverages Regular soft drinks. Regular vegetable juice. Seasonings and other foods Seasoning blends with salt. Salad dressings. Canned soups. Soy sauce. Ketchup. Barbecue sauce. Canned pasta sauce. Casseroles. Pizza. Lasagna. Frozen meals. Potato chips. [place] fries. Summary  You can reduce your risk of kidney stones by making changes to your diet.  The most important thing you can do is drink enough fluid. You should drink enough fluid to keep your urine clear or pale yellow.  Ask your health care provider or dietitian how much protein from animal sources you should eat each day, and also how much salt and calcium you should have each day. This information is not intended to replace advice given to you by your health care provider. Make sure you discuss any questions you have with your health care provider. Document Released: 09/27/2010 Document Revised: 09/22/2018 Document Reviewed: 05/13/2016 Elsevier Patient Education  [name].  Holmium Laser Enucleation of the Prostate (HoLEP)  HoLEP is a treatment for men with benign prostatic hyperplasia (BPH). The laser surgery  removed blockages of urine flow, and is done without any incisions on the body.     What is HoLEP?  HoLEP is a type of laser surgery used to treat obstruction (blockage) of urine flow as a result of benign prostatic hyperplasia (BPH). In men with BPH, the prostate gland is not cancerous, but has become enlarged. An enlarged prostate can result in a number of urinary tract symptoms such as weak urinary stream, difficulty in starting urination, inability to urinate, frequent urination, or getting up at night to urinate.  HoLEP was developed in the 1990's as a more effective and less expensive surgical option for BPH, compared to other surgical options such as laser vaporization(PVP/greenlight laser), transurethral resection of the prostate(TURP), and open simple prostatectomy.   What happens during a HoLEP?  HoLEP requires general anesthesia ("asleep" throughout the procedure).   An antibiotic is given to reduce the risk of infection  A surgical instrument called a resectoscope is inserted through the urethra (the tube that carries urine  from the bladder). The resectoscope has a camera that allows the surgeon to view the internal structure of the prostate gland, and to see where the incisions are being made during surgery.  The laser is inserted into the resectoscope and is used to enucleate (free up) the enlarged prostate tissue from the capsule (outer shell) and then to seal up any blood vessels. The tissue that has been removed is pushed back into the bladder.  A morcellator is placed through the resectoscope, and is used to suction out the prostate tissue that has been pushed into the bladder.  When the prostate tissue has been removed, the resectoscope is removed, and a foley catheter is placed to allow healing and drain the urine from the bladder.     What happens after a HoLEP?  More than 90% of patients go home the same day a few hours after surgery. Less than 10% will be admitted to  the hospital overnight for observation to monitor the urine, or if they have other medical problems.  Fluid is flushed through the catheter for about 1 hour after surgery to clear any blood from the urine. It is normal to have some blood in the urine after surgery. The need for blood transfusion is extremely rare.  Eating and drinking are permitted after the procedure once the patient has fully awakened from anesthesia.  The catheter is usually removed 2-3 days after surgery- the patient will come to clinic to have the catheter removed and make sure they can urinate on their own.  It is very important to drink lots of fluids after surgery for one week to keep the bladder flushed.  At first, there may be some burning with urination, but this typically improved within a few hours to days. Most patients do not have a significant amount of pain, and narcotic pain medications are rarely needed.  Symptoms of urinary frequency, urgency, and even leakage are NORMAL for the first few weeks after surgery as the bladder adjusts after having to work hard against blockage from the prostate for many years. This will improve, but can sometimes take several months.  The use of pelvic floor exercises (Kegel exercises) can help improve problems with urinary incontinence.   After catheter removal, patients will be seen at 6 weeks and 6 months for symptom check  No heavy lifting for at least 2-3 weeks after surgery, however patients can walk and do light activities the first day after surgery. Return to work time depends on occupation.    What are the advantages of HoLEP?  HoLEP has been studied in many different parts of the world and has been shown to be a safe and effective procedure. Although there are many types of BPH surgeries available, HoLEP offers a unique advantage in being able to remove a large amount of tissue without any incisions on the body, even in very large prostates, while decreasing the  risk of bleeding and providing tissue for pathology (to look for cancer). This decreases the need for blood transfusions during surgery, minimizes hospital stay, and reduces the risk of needing repeat treatment.  What are the side effects of HoLEP?  Temporary burning and bleeding during urination. Some blood may be seen in the urine for weeks after surgery and is part of the healing process.  Urinary incontinence (inability to control urine flow) is expected in all patients immediately after surgery and they should wear pads for the first few days/weeks. This typically improves over the course of  several weeks. Performing Kegel exercises can help decrease leakage from stress maneuvers such as coughing, sneezing, or lifting. The rate of long term leakage is very low. Patients may also have leakage with urgency and this may be treated with medication. The risk of urge incontinence can be dependent on several factors including age, prostate size, symptoms, and other medical problems.  Retrograde ejaculation or "backwards ejaculation." In 75% of cases, the patient will not see any fluid during ejaculation after surgery.  Erectile function is generally not significantly affected.   What are the risks of HoLEP?  Injury to the urethra or development of scar tissue at a later date  Injury to the capsule of the prostate (typically treated with longer catheterization).  Injury to the bladder or ureteral orifices (where the urine from the kidney drains out)  Infection of the bladder, testes, or kidneys  Return of urinary obstruction at a later date requiring another operation (<2%)  Need for blood transfusion or re-operation due to bleeding  Failure to relieve all symptoms and/or need for prolonged catheterization after surgery  5-15% of patients are found to have previously undiagnosed prostate cancer in their specimen. Prostate cancer can be treated after HoLEP.  Standard risks of anesthesia  including blood clots, heart attacks, etc  When should I call my doctor?  Fever over 101.3 degrees  Inability to urinate, or large blood clots in the urine

## 2018-12-27 NOTE — Progress Notes (Signed)
   12/27/2018 8:50 AM   Mary Ayers 05-03-1971 [id number]  Reason for visit: Follow up nephrolithiasis  HPI: I saw Ms. back back in urology clinic today for follow-up of nephrolithiasis.  Briefly, she is a healthy 48 year old female that works at [employer] and underwent successful left shockwave lithotripsy on 04/29/2018 for an 8 mm proximal ureteral stone.    She underwent a 24-hour urine in December 2019 with results notable for low urine volume of 1.92 L, elevated supersaturation of calcium oxalate 9.94, elevated urinary calcium of 229, elevated urinary oxalate of 46, and borderline elevated sodium of 147.  Favorable findings were citrate of 991.  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  She was supposed to follow-up today with a new 24-hour urine test, and KUB.  24-hour urine has not been completed.  KUB today shows stable stone disease with minimal left lower pole stone burden.  She denies any stone events, flank pain, hematuria, or other problems since we saw her last.  We discussed stone prevention strategies including adequate hydration with goal of producing 2.5 L of urine daily, increasing citric acid intake, increasing calcium intake during high oxalate meals, minimizing animal protein, and decreasing salt intake. Information about dietary recommendations given today.  We also discussed addition of 2 to 3 packets of litholyte a day for stone prevention.  RTC 1 year for KUB and 24-hour urine  A total of 15 minutes were spent face-to-face with the patient, greater than 50% was spent in patient education, counseling, and coordination of care regarding nephrolithiasis and stone prevention.  Billey Co, [facility] [address] [place], [place] [address] [phone]

## 2018-12-27 NOTE — Telephone Encounter (Signed)
[employer] faxed.

## 2019-01-11 ENCOUNTER — Other Ambulatory Visit: Payer: Self-pay | Admitting: Nurse Practitioner

## 2019-01-11 ENCOUNTER — Other Ambulatory Visit: Payer: Self-pay | Admitting: Obstetrics and Gynecology

## 2019-01-11 DIAGNOSIS — N6489 Other specified disorders of breast: Secondary | ICD-10-CM

## 2019-01-19 ENCOUNTER — Other Ambulatory Visit: Payer: Self-pay

## 2019-01-19 ENCOUNTER — Ambulatory Visit
Admission: RE | Admit: 2019-01-19 | Discharge: 2019-01-19 | Disposition: A | Discharge status: Home / Self Care | Payer: Managed Care, Other (non HMO) | Source: Ambulatory Visit | Attending: Obstetrics and Gynecology | Admitting: Obstetrics and Gynecology

## 2019-01-19 ENCOUNTER — Other Ambulatory Visit: Payer: Managed Care, Other (non HMO)

## 2019-01-19 DIAGNOSIS — N6489 Other specified disorders of breast: Secondary | ICD-10-CM

## 2019-02-03 ENCOUNTER — Other Ambulatory Visit: Payer: Self-pay | Admitting: Urology

## 2019-08-26 ENCOUNTER — Ambulatory Visit: Payer: Self-pay | Attending: Internal Medicine

## 2019-08-26 DIAGNOSIS — Z23 Encounter for immunization: Secondary | ICD-10-CM

## 2019-08-26 NOTE — Progress Notes (Signed)
   Covid-19 Vaccination Clinic  Name:  Mary Ayers    MRN: [id number] DOB: [birth year]  08/26/2019  [name] was observed post Covid-19 immunization for 15 minutes without incident. She was provided with Vaccine Information Sheet and instruction to access the V-Safe system.   [name] was instructed to call 911 with any severe reactions post vaccine: Marland Kitchen Difficulty breathing  . Swelling of face and throat  . A fast heartbeat  . A bad rash all over body  . Dizziness and weakness   Immunizations Administered    Name Date Dose VIS Date Route   Moderna COVID-19 Vaccine 08/26/2019 10:51 AM 0.5 mL 05/17/2019 Intramuscular   Manufacturer: Moderna   Lot: [id number]   NDC: 08811-031-59

## 2019-08-30 DIAGNOSIS — J310 Chronic rhinitis: Secondary | ICD-10-CM | POA: Insufficient documentation

## 2019-08-30 DIAGNOSIS — J342 Deviated nasal septum: Secondary | ICD-10-CM | POA: Insufficient documentation

## 2019-08-30 DIAGNOSIS — J3489 Other specified disorders of nose and nasal sinuses: Secondary | ICD-10-CM | POA: Insufficient documentation

## 2019-09-28 ENCOUNTER — Ambulatory Visit: Payer: Self-pay | Attending: Internal Medicine

## 2019-09-28 DIAGNOSIS — Z23 Encounter for immunization: Secondary | ICD-10-CM

## 2019-09-28 NOTE — Progress Notes (Signed)
   Covid-19 Vaccination Clinic  Name:  Mary Ayers    MRN: [id number] DOB: 1971/02/23  09/28/2019  [name] was observed post Covid-19 immunization for 15 minutes without incident. She was provided with Vaccine Information Sheet and instruction to access the V-Safe system.   [name] was instructed to call 911 with any severe reactions post vaccine: Marland Kitchen Difficulty breathing  . Swelling of face and throat  . A fast heartbeat  . A bad rash all over body  . Dizziness and weakness   Immunizations Administered    Name Date Dose VIS Date Route   Moderna COVID-19 Vaccine 09/28/2019  9:05 AM 0.5 mL 05/17/2019 Intramuscular   Manufacturer: Moderna   Lot: [id number]   NDC: 31121-624-46

## 2019-12-23 ENCOUNTER — Other Ambulatory Visit: Payer: Self-pay

## 2019-12-23 DIAGNOSIS — N200 Calculus of kidney: Secondary | ICD-10-CM

## 2019-12-29 ENCOUNTER — Ambulatory Visit: Payer: Managed Care, Other (non HMO) | Admitting: Urology

## 2020-03-13 ENCOUNTER — Ambulatory Visit
Admission: RE | Admit: 2020-03-13 | Discharge: 2020-03-13 | Disposition: A | Discharge status: Home / Self Care | Payer: Managed Care, Other (non HMO) | Source: Ambulatory Visit | Attending: Urology | Admitting: Urology

## 2020-03-13 ENCOUNTER — Ambulatory Visit
Admission: RE | Admit: 2020-03-13 | Discharge: 2020-03-13 | Disposition: A | Discharge status: Home / Self Care | Payer: Managed Care, Other (non HMO) | Attending: Urology | Admitting: Urology

## 2020-03-13 DIAGNOSIS — N200 Calculus of kidney: Secondary | ICD-10-CM

## 2020-03-14 ENCOUNTER — Encounter: Payer: Self-pay | Admitting: Urology

## 2020-03-14 ENCOUNTER — Other Ambulatory Visit: Payer: Self-pay

## 2020-03-14 ENCOUNTER — Ambulatory Visit (INDEPENDENT_AMBULATORY_CARE_PROVIDER_SITE_OTHER): Payer: Managed Care, Other (non HMO) | Admitting: Urology

## 2020-03-14 VITALS — BP 143/95 | HR 82 | Ht 61.0 in | Wt 160.0 lb

## 2020-03-14 DIAGNOSIS — N200 Calculus of kidney: Secondary | ICD-10-CM | POA: Diagnosis not present

## 2020-03-14 NOTE — Patient Instructions (Signed)
Dietary Guidelines to Help Prevent Kidney Stones Kidney stones are deposits of minerals and salts that form inside your kidneys. Your risk of developing kidney stones may be greater depending on your diet, your lifestyle, the medicines you take, and whether you have certain medical conditions. Most people can reduce their chances of developing kidney stones by following the instructions below. Depending on your overall health and the type of kidney stones you tend to develop, your dietitian may give you more specific instructions. What are tips for following this plan? Reading food labels  Choose foods with "no salt added" or "low-salt" labels. Limit your sodium intake to less than 1500 mg per day.  Choose foods with calcium for each meal and snack. Try to eat about 300 mg of calcium at each meal. Foods that contain 200-500 mg of calcium per serving include: ? 8 oz (237 ml) of milk, fortified nondairy milk, and fortified fruit juice. ? 8 oz (237 ml) of kefir, yogurt, and soy yogurt. ? 4 oz (118 ml) of tofu. ? 1 oz of cheese. ? 1 cup (300 g) of dried figs. ? 1 cup (91 g) of cooked broccoli. ? 1-3 oz can of sardines or mackerel.  Most people need 1000 to 1500 mg of calcium each day. Talk to your dietitian about how much calcium is recommended for you. Shopping  Buy plenty of fresh fruits and vegetables. Most people do not need to avoid fruits and vegetables, even if they contain nutrients that may contribute to kidney stones.  When shopping for convenience foods, choose: ? Whole pieces of fruit. ? Premade salads with dressing on the side. ? Low-fat fruit and yogurt smoothies.  Avoid buying frozen meals or prepared deli foods.  Look for foods with live cultures, such as yogurt and kefir. Cooking  Do not add salt to food when cooking. Place a salt shaker on the table and allow each person to add his or her own salt to taste.  Use vegetable protein, such as beans, textured vegetable  protein (TVP), or tofu instead of meat in pasta, casseroles, and soups. Meal planning   Eat less salt, if told by your dietitian. To do this: ? Avoid eating processed or premade food. ? Avoid eating fast food.  Eat less animal protein, including cheese, meat, poultry, or fish, if told by your dietitian. To do this: ? Limit the number of times you have meat, poultry, fish, or cheese each week. Eat a diet free of meat at least 2 days a week. ? Eat only one serving each day of meat, poultry, fish, or seafood. ? When you prepare animal protein, cut pieces into small portion sizes. For most meat and fish, one serving is about the size of one deck of cards.  Eat at least 5 servings of fresh fruits and vegetables each day. To do this: ? Keep fruits and vegetables on hand for snacks. ? Eat 1 piece of fruit or a handful of berries with breakfast. ? Have a salad and fruit at lunch. ? Have two kinds of vegetables at dinner.  Limit foods that are high in a substance called oxalate. These include: ? Spinach. ? Rhubarb. ? Beets. ? Potato chips and french fries. ? Nuts.  If you regularly take a diuretic medicine, make sure to eat at least 1-2 fruits or vegetables high in potassium each day. These include: ? Avocado. ? Banana. ? Orange, prune, carrot, or tomato juice. ? Baked potato. ? Cabbage. ? Beans and split   peas. General instructions   Drink enough fluid to keep your urine clear or pale yellow. This is the most important thing you can do.  Talk to your health care provider and dietitian about taking daily supplements. Depending on your health and the cause of your kidney stones, you may be advised: ? Not to take supplements with vitamin C. ? To take a calcium supplement. ? To take a daily probiotic supplement. ? To take other supplements such as magnesium, fish oil, or vitamin B6.  Take all medicines and supplements as told by your health care provider.  Limit alcohol intake to no  more than 1 drink a day for nonpregnant women and 2 drinks a day for men. One drink equals 12 oz of beer, 5 oz of wine, or 1 oz of hard liquor.  Lose weight if told by your health care provider. Work with your dietitian to find strategies and an eating plan that works best for you. What foods are not recommended? Limit your intake of the following foods, or as told by your dietitian. Talk to your dietitian about specific foods you should avoid based on the type of kidney stones and your overall health. Grains Breads. Bagels. Rolls. Baked goods. Salted crackers. Cereal. Pasta. Vegetables Spinach. Rhubarb. Beets. Canned vegetables. Pickles. Olives. Meats and other protein foods Nuts. Nut butters. Large portions of meat, poultry, or fish. Salted or cured meats. Deli meats. Hot dogs. Sausages. Dairy Cheese. Beverages Regular soft drinks. Regular vegetable juice. Seasonings and other foods Seasoning blends with salt. Salad dressings. Canned soups. Soy sauce. Ketchup. Barbecue sauce. Canned pasta sauce. Casseroles. Pizza. Lasagna. Frozen meals. Potato chips. French fries. Summary  You can reduce your risk of kidney stones by making changes to your diet.  The most important thing you can do is drink enough fluid. You should drink enough fluid to keep your urine clear or pale yellow.  Ask your health care provider or dietitian how much protein from animal sources you should eat each day, and also how much salt and calcium you should have each day. This information is not intended to replace advice given to you by your health care provider. Make sure you discuss any questions you have with your health care provider. Document Revised: 09/22/2018 Document Reviewed: 05/13/2016 Elsevier Patient Education  2020 [employer]vier Inc.  

## 2020-03-14 NOTE — Progress Notes (Signed)
   03/14/2020 3:55 PM   Mary Ayers [birth year] [id number]  Reason for visit: Follow up nephrolithiasis  HPI: I saw [name] back in urology clinic today for follow-up of nephrolithiasis.  Briefly, she is a healthy 49 year old female that works at [employer] and underwent successful left shockwave lithotripsy on 04/29/2018 for an 8 mm proximal ureteral stone.    Stone type was calcium oxalate.  She underwent a 24-hour urine in December 2019 with results notable for low urine volume of 1.92 L, elevated supersaturation of calcium oxalate 9.94, elevated urinary calcium of 229, elevated urinary oxalate of 46, and borderline elevated sodium of 147.  Favorable findings were citrate of 991.  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  She denies any stone episodes, flank pain, or gross hematuria since her last visit.  I personally reviewed her KUB today that shows no changes in her very small left lower pole stone fragments.  We discussed stone prevention strategies including adequate hydration with goal of producing 2.5 L of urine daily, increasing citric acid intake, increasing calcium intake during high oxalate meals, minimizing animal protein, and decreasing salt intake. Information about dietary recommendations given today.    RTC 1 year with KUB prior  Sondra Come, MD  [facility] [address], [address] [place], [place] [address] [phone]

## 2020-05-28 DIAGNOSIS — J329 Chronic sinusitis, unspecified: Secondary | ICD-10-CM | POA: Insufficient documentation

## 2021-03-14 ENCOUNTER — Ambulatory Visit: Payer: Self-pay | Admitting: Urology

## 2021-05-27 ENCOUNTER — Other Ambulatory Visit: Payer: Self-pay | Admitting: Obstetrics and Gynecology

## 2021-05-27 DIAGNOSIS — Z1231 Encounter for screening mammogram for malignant neoplasm of breast: Secondary | ICD-10-CM

## 2021-05-31 ENCOUNTER — Other Ambulatory Visit: Payer: Self-pay

## 2021-05-31 ENCOUNTER — Ambulatory Visit
Admission: RE | Admit: 2021-05-31 | Discharge: 2021-05-31 | Disposition: A | Discharge status: Home / Self Care | Payer: Managed Care, Other (non HMO) | Source: Ambulatory Visit | Attending: Obstetrics and Gynecology | Admitting: Obstetrics and Gynecology

## 2021-05-31 DIAGNOSIS — Z1231 Encounter for screening mammogram for malignant neoplasm of breast: Secondary | ICD-10-CM | POA: Diagnosis present

## 2022-12-17 ENCOUNTER — Other Ambulatory Visit: Payer: Self-pay | Admitting: Obstetrics and Gynecology

## 2022-12-17 DIAGNOSIS — Z1231 Encounter for screening mammogram for malignant neoplasm of breast: Secondary | ICD-10-CM

## 2022-12-19 ENCOUNTER — Ambulatory Visit
Admission: RE | Admit: 2022-12-19 | Discharge: 2022-12-19 | Disposition: A | Discharge status: Home / Self Care | Payer: Managed Care, Other (non HMO) | Source: Ambulatory Visit | Attending: Obstetrics and Gynecology | Admitting: Obstetrics and Gynecology

## 2022-12-19 DIAGNOSIS — Z1231 Encounter for screening mammogram for malignant neoplasm of breast: Secondary | ICD-10-CM | POA: Insufficient documentation

## 2022-12-24 ENCOUNTER — Other Ambulatory Visit: Payer: Self-pay | Admitting: Obstetrics and Gynecology

## 2022-12-24 DIAGNOSIS — R928 Other abnormal and inconclusive findings on diagnostic imaging of breast: Secondary | ICD-10-CM

## 2023-01-08 ENCOUNTER — Other Ambulatory Visit: Payer: Managed Care, Other (non HMO)

## 2023-07-15 ENCOUNTER — Ambulatory Visit
Admission: RE | Admit: 2023-07-15 | Discharge: 2023-07-15 | Disposition: A | Discharge status: Home / Self Care | Payer: Managed Care, Other (non HMO) | Source: Ambulatory Visit | Attending: Obstetrics and Gynecology | Admitting: Obstetrics and Gynecology

## 2023-07-15 DIAGNOSIS — R928 Other abnormal and inconclusive findings on diagnostic imaging of breast: Secondary | ICD-10-CM | POA: Diagnosis present

## 2023-08-20 ENCOUNTER — Ambulatory Visit: Payer: Managed Care, Other (non HMO) | Admitting: Podiatry

## 2023-09-07 ENCOUNTER — Ambulatory Visit (INDEPENDENT_AMBULATORY_CARE_PROVIDER_SITE_OTHER)

## 2023-09-07 ENCOUNTER — Encounter: Payer: Self-pay | Admitting: Podiatry

## 2023-09-07 ENCOUNTER — Ambulatory Visit: Admitting: Podiatry

## 2023-09-07 DIAGNOSIS — M2012 Hallux valgus (acquired), left foot: Secondary | ICD-10-CM | POA: Diagnosis not present

## 2023-09-07 NOTE — Progress Notes (Signed)
 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 female presents with the above complaint.   ROS: She denies fever chills nausea vomiting muscle aches pains calf pain back pain chest pain shortness of breath.  Past Medical History:  Diagnosis Date   History of kidney stones    Kidney stone    Past Surgical History:  Procedure Laterality Date   EXTRACORPOREAL SHOCK WAVE LITHOTRIPSY Left 06/14/2015   Procedure: EXTRACORPOREAL SHOCK WAVE LITHOTRIPSY (ESWL);  Surgeon: Orson Ape, MD;  Location: [facility] ORS;  Service: Urology;  Laterality: Left;   EXTRACORPOREAL SHOCK WAVE LITHOTRIPSY Left 04/29/2018   Procedure: EXTRACORPOREAL SHOCK WAVE LITHOTRIPSY (ESWL);  Surgeon: Sondra Come, MD;  Location: [facility] ORS;  Service: Urology;  Laterality: Left;   INTRAUTERINE DEVICE INSERTION     RHINOPLASTY     SINUS IRRIGATION     TONSILLECTOMY      Current Outpatient Medications:    WEGOVY 2.4 MG/0.75ML SOAJ, SMARTSIG:2.4 Milligram(s) SUB-Q Once a Week, Disp: , Rfl:    levonorgestrel (MIRENA) 20 MCG/DAY IUD, by Intrauterine route., Disp: , Rfl:   Allergies  Allergen Reactions   Naproxen Rash   Review of Systems Objective:  There were no vitals filed for this visit.  General: Well developed, nourished, in no acute distress, alert and oriented x3   Dermatological: Skin is warm, dry and supple bilateral. Nails x 10 are well maintained; remaining integument appears unremarkable at this time. There are no open sores, no preulcerative lesions, no rash or signs of infection present.  Vascular: Dorsalis Pedis artery and Posterior Tibial artery pedal pulses are 2/4 bilateral with immedate capillary fill time. Pedal hair growth present. No varicosities and no lower extremity edema  present bilateral.   Neruologic: Grossly intact via light touch bilateral. Vibratory intact via tuning fork bilateral. Protective threshold with Semmes Wienstein monofilament intact to all pedal sites bilateral. Patellar and Achilles deep tendon reflexes 2+ bilateral. No Babinski or clonus noted bilateral.   Musculoskeletal: No gross boney pedal deformities bilateral. No pain, crepitus, or limitation noted with foot and ankle range of motion bilateral. Muscular strength 5/5 in all groups tested bilateral.  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  Her angular deformity appears to be minimal however she has a large hypertrophic medial condyle which is causing her to experience neuritis around the first metatarsal phalangeal joint medially.  She also has atrophy of the adductor hallucis muscle belly.  Most likely this is traumatic or neurologic in origin.  Gait: Unassisted, Nonantalgic.    Radiographs:  Radiographs taken today demonstrate an osseously mature individual good bone mineralization.  Slight increase in the first intermetatarsal angle but within normal limits the joint is congruous of the first metatarsophalangeal joint however she does have a large hypertrophic medial condyle with soft tissue swelling dorsally medially and plantarly.  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  At this point she would like to have this hypertrophic medial condyle resected.  We did consent her today for McBride bunion repair which will allow for some straightening of  the toe as well as a reduction in the neuritis with the resection of the hypertrophic condyle.  We discussed the pros and cons of the surgery she understands this and is amenable to it.  We discussed the possible postop complications which  may include but not limited to postop pain bleeding swell infection recurrence need further surgery overcorrection under correction loss of digit loss limb loss of life.     Zamier Eggebrecht T. [place], [place]

## 2023-09-08 ENCOUNTER — Telehealth: Payer: Self-pay | Admitting: Podiatry

## 2023-09-08 NOTE — Telephone Encounter (Signed)
 Left message for pt to call to schedule surgery with Dr Al Corpus as we received the surgical consent forms.

## 2023-09-15 ENCOUNTER — Ambulatory Visit: Admitting: Dermatology

## 2023-09-15 ENCOUNTER — Encounter: Payer: Self-pay | Admitting: Dermatology

## 2023-09-15 DIAGNOSIS — D1720 Benign lipomatous neoplasm of skin and subcutaneous tissue of unspecified limb: Secondary | ICD-10-CM

## 2023-09-15 DIAGNOSIS — D1724 Benign lipomatous neoplasm of skin and subcutaneous tissue of left leg: Secondary | ICD-10-CM

## 2023-09-15 DIAGNOSIS — D1801 Hemangioma of skin and subcutaneous tissue: Secondary | ICD-10-CM | POA: Diagnosis not present

## 2023-09-15 DIAGNOSIS — L814 Other melanin hyperpigmentation: Secondary | ICD-10-CM | POA: Diagnosis not present

## 2023-09-15 DIAGNOSIS — D1723 Benign lipomatous neoplasm of skin and subcutaneous tissue of right leg: Secondary | ICD-10-CM

## 2023-09-15 NOTE — Progress Notes (Signed)
   New Patient Visit   Subjective  Mary Ayers is a 53 y.o. female who presents for the following: subcutaneous nodules on B/L thighs. Dur: 6 months. Tender all the time to touch.   The patient has spots, moles and lesions to be evaluated, some may be new or changing and the patient may have concern these could be cancer.    The following portions of the chart were reviewed this encounter and updated as appropriate: medications, allergies, medical history  Review of Systems:  No other skin or systemic complaints except as noted in HPI or Assessment and Plan.  Objective  Well appearing patient in no apparent distress; mood and affect are within normal limits.  A focused examination was performed of the following areas: B/L thighs Relevant physical exam findings are noted in the Assessment and Plan.             Assessment & Plan   Lipoma vs Angiolipoma- symptomatic  Exam: Rubbery subcutaneous nodule at L ant thigh 0.6 cm.   Rubbery subcutaneous nodule at R ant thigh 1 cm, tender to touch  Location: B/L thighs  Benign-appearing. Exam most consistent with a lipoma. Discussed that a lipoma is a benign fatty growth that can grow over time and sometimes get irritated. Recommend observation if it is not bothersome or changing. Discussed option of ILK injections or surgical excision to remove it if it is growing, symptomatic, or other changes noted. Please call for new or changing lesions so they can be evaluated.   Lipoma right thigh with symptoms and/or recent change.  Discussed surgical excision to remove, including resulting scar and possible recurrence.  Patient will schedule for surgery. Pre-op information given.   LENTIGINES Exam: scattered tan macules thighs Due to sun exposure Treatment Plan: Benign-appearing, observe. Recommend daily broad spectrum sunscreen SPF 30+ to sun-exposed areas, reapply every 2 hours as needed.  Call for any changes   HEMANGIOMA Exam:  red papule(s) thighs Discussed benign nature. Recommend observation. Call for changes.    Return for Lipoma excision next available; suture removal 7 days later.  I, Lawson Radar, CMA, am acting as scribe for Willeen Niece, MD.   Documentation: I have reviewed the above documentation for accuracy and completeness, and I agree with the above.  Willeen Niece, MD

## 2023-09-15 NOTE — Patient Instructions (Signed)

## 2023-10-21 ENCOUNTER — Telehealth: Payer: Self-pay | Admitting: Podiatry

## 2023-10-21 NOTE — Telephone Encounter (Signed)
 Called Pt on 5/1,5/6,and 5/7 for her insurance as she doesn't have active insurance currently. I also left her a MyChart message. She has not returned either. Informed her that we may have to reschedule if we didn't hear back soon.

## 2023-10-22 ENCOUNTER — Telehealth: Payer: Self-pay | Admitting: Podiatry

## 2023-10-22 NOTE — Telephone Encounter (Signed)
 Pt called and was scheduled for surgery on 5/16 but is postponing until 7/25. Her mom is in the hospital and she is going to have to go be with her.

## 2023-10-28 ENCOUNTER — Ambulatory Visit: Admitting: Dermatology

## 2023-10-28 DIAGNOSIS — D1723 Benign lipomatous neoplasm of skin and subcutaneous tissue of right leg: Secondary | ICD-10-CM

## 2023-10-28 DIAGNOSIS — D485 Neoplasm of uncertain behavior of skin: Secondary | ICD-10-CM

## 2023-10-28 NOTE — Progress Notes (Signed)
 Follow-Up Visit   Subjective  Mary Ayers is a 53 y.o. female who presents for the following: Lipoma excision of the right anterior thigh. Symptomatic.  Has two lumps next to each other on her thigh. Would like them both removed today if possible since they are so close to each other.   The following portions of the chart were reviewed this encounter and updated as appropriate: medications, allergies, medical history  Review of Systems:  No other skin or systemic complaints except as noted in HPI or Assessment and Plan.  Objective  Well appearing patient in no apparent distress; mood and affect are within normal limits.  A focused examination was performed of the following areas: Right thigh  Relevant physical exam findings are noted in the Assessment and Plan.  Right Anterior Thigh Med Rubbery subcutaneous nodule at R ant thigh med 1.3 cm, tender to touch   Right Anterior Thigh Lat 0.8 cm rubbery subcutaneous nodule   Assessment & Plan   NEOPLASM OF UNCERTAIN BEHAVIOR OF SKIN (2) Right Anterior Thigh Med Skin excision  Lesion length (cm):  1.3 Lesion width (cm):  1.3 Margin per side (cm):  0.1 Total excision diameter (cm):  1.5 Informed consent: discussed and consent obtained   Timeout: patient name, date of birth, surgical site, and procedure verified   Procedure prep:  Patient was prepped and draped in usual sterile fashion Prep type:  Povidone-iodine Anesthesia: the lesion was anesthetized in a standard fashion   Anesthetic:  1% lidocaine w/ epinephrine 1-100,000 buffered w/ 8.4% NaHCO3 (Total 12 cc) Instrument used: #15 blade   Hemostasis achieved with: pressure and electrodesiccation   Outcome: patient tolerated procedure well with no complications    Skin repair Complexity:  Intermediate Final length (cm):  1.5 Informed consent: discussed and consent obtained   Reason for type of repair: reduce tension to allow closure, reduce the risk of dehiscence,  infection, and necrosis, reduce subcutaneous dead space and avoid a hematoma, preserve normal anatomical and functional relationships and enhance both functionality and cosmetic results   Undermining: edges could be approximated without difficulty and edges undermined   Subcutaneous layers (deep stitches):  Suture size:  4-0 Suture type: Vicryl (polyglactin 910)   Stitches:  Buried vertical mattress Fine/surface layer approximation (top stitches):  Suture size:  4-0 Suture type: nylon   Stitches: simple interrupted   Suture removal (days):  7 Hemostasis achieved with: suture and pressure Outcome: patient tolerated procedure well with no complications   Post-procedure details: sterile dressing applied and wound care instructions given   Dressing type: pressure dressing (mupirocin)   Specimen 1 - Surgical pathology Differential Diagnosis: Lipoma vs Angiolipoma vs other Check Margins: No Both specimen (R ant thigh medial and lateral) added to the same container for pathology. Right Anterior Thigh Lat Skin excision  Lesion length (cm):  0.8 Lesion width (cm):  0.8 Margin per side (cm):  0.1 Total excision diameter (cm):  1 Informed consent: discussed and consent obtained   Timeout: patient name, date of birth, surgical site, and procedure verified   Procedure prep:  Patient was prepped and draped in usual sterile fashion Prep type:  Povidone-iodine Anesthesia: the lesion was anesthetized in a standard fashion   Anesthetic:  1% lidocaine w/ epinephrine 1-100,000 buffered w/ 8.4% NaHCO3 Instrument used: #15 blade   Hemostasis achieved with: pressure and electrodesiccation   Outcome: patient tolerated procedure well with no complications    Skin repair Complexity:  Intermediate Final length (cm):  0.9 Informed consent: discussed and consent obtained   Reason for type of repair: reduce tension to allow closure, reduce the risk of dehiscence, infection, and necrosis, reduce  subcutaneous dead space and avoid a hematoma, preserve normal anatomical and functional relationships and enhance both functionality and cosmetic results   Undermining: edges could be approximated without difficulty and edges undermined   Subcutaneous layers (deep stitches):  Suture size:  4-0 Suture type: Vicryl (polyglactin 910)   Stitches:  Buried vertical mattress Fine/surface layer approximation (top stitches):  Suture size:  4-0 Suture type: nylon   Stitches: simple interrupted   Suture removal (days):  7 Hemostasis achieved with: suture and pressure Outcome: patient tolerated procedure well with no complications   Post-procedure details: sterile dressing applied and wound care instructions given   Dressing type: pressure dressing (mupirocin)   Lipoma vs Angiolipoma excision x 2 at right anterior thigh medial and right anterior thigh lateral. Specimen were both added to the same bottle for pathology.    Return in about 1 week (around 11/04/2023) for suture removal.  I, Bernardine Bridegroom, CMA, am acting as scribe for Artemio Larry, MD .   Documentation: I have reviewed the above documentation for accuracy and completeness, and I agree with the above.  Artemio Larry, MD

## 2023-10-28 NOTE — Patient Instructions (Signed)
Wound Care Instructions for After Surgery  On the day following your surgery, you should begin doing daily dressing changes until your sutures are removed:  Remove the bandage.  Cleanse the wound gently with soap and water.   Make sure you then dry the skin surrounding the wound completely or the tape will not stick to the skin. Do not use cotton balls on the wound.  After the wound is clean and dry, apply the ointment (either prescription antibiotic prescribed by your doctor or plain Vaseline if nothing was prescribed) gently with a Q-tip.  If you are using a bandaid to cover:  Apply a bandaid large enough to cover the entire wound.  If you do not have a bandaid large enough to cover the wound OR if you are sensitive to bandaid adhesive:  Cut a non-stick pad (such as Telfa) to fit the size of the wound.   Cover the wound with the non-stick pad.  If the wound is draining, you may want to add a small amount of gauze on top of the non-stick pad for a little added compression to the area.  Use tape to seal the area completely.  For the next 1-2 weeks:  Be sure to keep the wound moist with ointment 24/7 to ensure best healing. If you are unable to cover the wound with a bandage to hold the ointment in place, you may need to reapply the ointment several times a day.  Do not bend over or lift heavy items to reduce the chance of elevated blood pressure to the wound.  Do not participate in particularly strenuous activities.  Below is a list of dressing supplies you might need.   Cotton-tipped applicators - Q-tips  Gauze pads (2x2 and/or 4x4) - All-Purpose Sponges  New and clean tube of petroleum jelly (Vaseline) OR prescription antibiotic ointment if prescribed  Either a bandaid large enough to cover the entire wound OR non-stick dressing material (Telfa) and Tape (Paper or Hypafix)  FOR ADULT SURGERY PATIENTS: If you need something for pain relief, you may take 1 extra strength  Tylenol (acetaminophen) and 2 ibuprofen (200 mg) together every 4 hours as needed. (Do not take these medications if you are allergic to them or if you know you cannot take them for any other reason). Typically you may only need pain medication for 1-3 days.   Comments on the Post-Operative Period Slight swelling and redness often appear around the wound. This is normal and will disappear within several days following the surgery. The healing wound will drain a brownish-red-yellow discharge during healing. This is a normal phase of wound healing. As the wound begins to heal, the drainage may increase in amount. Again, this drainage is normal. Notify us if the drainage becomes persistently bloody, excessively swollen, or intensely painful or develops a foul odor or red streaks.  The healing wound will also typically be itchy. This is normal. If you have severe or persistent pain, Notify us if the discomfort is severe or persistent. Avoid alcoholic beverages when taking pain medicine.  In Case of Wound Hemorrhage A wound hemorrhage is when the bandage suddenly becomes soaked with bright red blood and flows profusely. If this happens, sit down or lie down with your head elevated. If the wound has a dressing on it, do not remove the dressing. Apply pressure to the existing gauze. If the wound is not covered, use a gauze pad to apply pressure and continue applying the pressure for 20 minutes without   peeking. DO NOT COVER THE WOUND WITH A LARGE TOWEL OR WASH CLOTH. Release your hand from the wound site but do not remove the dressing. If the bleeding has stopped, gently clean around the wound. Leave the dressing in place for 24 hours if possible. This wait time allows the blood vessels to close off so that you do not spark a new round of bleeding by disrupting the newly clotted blood vessels with an immediate dressing change. If the bleeding does not subside, continue to hold pressure for 40 minutes. If bleeding  continues, page your physician, contact an After Hours clinic or go to the Emergency Room. ?  

## 2023-10-29 ENCOUNTER — Telehealth: Payer: Self-pay

## 2023-10-29 NOTE — Telephone Encounter (Signed)
 Left pt msg to call if any problems after yesterday's surgery.Marguerite Olea

## 2023-10-29 NOTE — Telephone Encounter (Signed)
-----   Message from [facility] Melanie L sent at 10/28/2023  5:56 PM EDT ----- Would you call my surgery please?? Thank you!!

## 2023-11-02 ENCOUNTER — Telehealth: Payer: Self-pay | Admitting: Podiatry

## 2023-11-02 NOTE — Telephone Encounter (Signed)
 Pt left message this morning asking for a out of pocket cost for her surgery that is scheduled in July with Dr Lara Plants. She is trying to see if she needs to increase her fsa acoount while she is able too.  I have forwarded the request to our billing dept and to surgical center for them to contact pt for a quote.  I left message for pt that I have forwarded her request to our billing and to surgery center for them to contact pt about her out of pocket cost.

## 2023-11-03 ENCOUNTER — Ambulatory Visit (INDEPENDENT_AMBULATORY_CARE_PROVIDER_SITE_OTHER): Admitting: Dermatology

## 2023-11-03 ENCOUNTER — Ambulatory Visit: Payer: Self-pay | Admitting: Dermatology

## 2023-11-03 DIAGNOSIS — D485 Neoplasm of uncertain behavior of skin: Secondary | ICD-10-CM

## 2023-11-03 DIAGNOSIS — D1723 Benign lipomatous neoplasm of skin and subcutaneous tissue of right leg: Secondary | ICD-10-CM

## 2023-11-03 LAB — SURGICAL PATHOLOGY

## 2023-11-03 NOTE — Patient Instructions (Signed)

## 2023-11-03 NOTE — Progress Notes (Signed)
   Follow-Up Visit   Subjective  Mary Ayers is a 53 y.o. female who presents for the following: Suture removal  Pathology report still pending.   The following portions of the chart were reviewed this encounter and updated as appropriate: medications, allergies, medical history  Review of Systems:  No other skin or systemic complaints except as noted in HPI or Assessment and Plan.  Objective  Well appearing patient in no apparent distress; mood and affect are within normal limits.  Areas Examined: Right thigh Relevant physical exam findings are noted in the Assessment and Plan.    Assessment & Plan    Encounter for Removal of Sutures - Incision site is clean, dry and intact. - Wound cleansed, sutures removed, wound cleansed and steri strips applied.  - Discussed pathology results still pending. - Patient advised to keep steri-strips dry until they fall off. - Scars remodel for a full year. - Once steri-strips fall off, patient can apply over-the-counter silicone scar cream once to twice a day to help with scar remodeling if desired. - Patient advised to call with any concerns or if they notice any new or changing lesions.  Return if symptoms worsen or fail to improve.  I, Jacquelynn Vera, CMA, am acting as scribe for Artemio Larry, MD .   Documentation: I have reviewed the above documentation for accuracy and completeness, and I agree with the above.  Artemio Larry, MD

## 2023-11-04 ENCOUNTER — Encounter: Payer: Self-pay | Admitting: Podiatry

## 2023-11-04 ENCOUNTER — Encounter: Admitting: Dermatology

## 2023-11-04 NOTE — Telephone Encounter (Signed)
 Left pt msg to call for bx results/sh

## 2023-11-04 NOTE — Telephone Encounter (Signed)
-----   Message from Artemio Larry sent at 11/03/2023  8:52 PM EDT ----- 1. Skin (M), right anterior thigh :       LIPOMA  Benign - please call patient

## 2023-11-10 NOTE — Telephone Encounter (Signed)
 Advised pt of bx results/sh ?

## 2023-11-10 NOTE — Telephone Encounter (Signed)
-----   Message from Artemio Larry sent at 11/03/2023  8:52 PM EDT ----- 1. Skin (M), right anterior thigh :       LIPOMA  Benign - please call patient

## 2023-11-18 ENCOUNTER — Encounter: Payer: Self-pay | Admitting: Podiatry

## 2023-12-09 ENCOUNTER — Encounter: Payer: Self-pay | Admitting: Podiatry

## 2024-01-06 ENCOUNTER — Telehealth: Payer: Self-pay | Admitting: Podiatry

## 2024-01-06 ENCOUNTER — Other Ambulatory Visit: Payer: Self-pay | Admitting: Podiatry

## 2024-01-06 MED ORDER — ONDANSETRON HCL 4 MG PO TABS: 4.0000 mg | ORAL_TABLET | Freq: Three times a day (TID) | ORAL | 0 refills | Status: AC | PRN

## 2024-01-06 MED ORDER — OXYCODONE-ACETAMINOPHEN 10-325 MG PO TABS: 1.0000 | ORAL_TABLET | Freq: Three times a day (TID) | ORAL | 0 refills | Status: AC | PRN

## 2024-01-06 MED ORDER — CEPHALEXIN 500 MG PO CAPS: 500.0000 mg | ORAL_CAPSULE | Freq: Three times a day (TID) | ORAL | 0 refills | Status: AC

## 2024-01-06 NOTE — Telephone Encounter (Signed)
 DOS- 01/08/2024  KELLER/MCBRIDE BUNIONECTOMY BLOCK LT- 28292  [employer] EFFECTIVE DATE- 10/09/2023  DEDUCTIBLE- $1000/FAMILY- $2000 REMAINING- $993.59/FAMILY REMAINING- $1169.50 OOP- $3000 REMAINING- 993.59 FAMILY REMAINING- $1351.90 COINSURANCE- 20% REF# [id number] 11:46 AM  PER [employer], PRIOR AUTH HAS BEEN APPROVED FROM 01/08/2024-04/09/2024. AUTH# [id number] REF# [id number] 01/06/2024 9:16 AM EST

## 2024-01-07 ENCOUNTER — Telehealth: Payer: Self-pay | Admitting: Podiatry

## 2024-01-07 NOTE — Telephone Encounter (Signed)
 Pt left message yesterday 7/23 at 315pm stating she was returning a call to [facility]..  Pt called right back and it was Arland from [place] that had called and changed pts pov # 3 from 9/3 to 9/10 and I gave pt that information.   I also gave her to surgery center for her to call to get her arrival time for her surgery.

## 2024-01-08 DIAGNOSIS — M2012 Hallux valgus (acquired), left foot: Secondary | ICD-10-CM | POA: Diagnosis not present

## 2024-01-13 ENCOUNTER — Ambulatory Visit (INDEPENDENT_AMBULATORY_CARE_PROVIDER_SITE_OTHER)

## 2024-01-13 ENCOUNTER — Ambulatory Visit (INDEPENDENT_AMBULATORY_CARE_PROVIDER_SITE_OTHER): Payer: Self-pay | Admitting: Podiatry

## 2024-01-13 DIAGNOSIS — M2012 Hallux valgus (acquired), left foot: Secondary | ICD-10-CM | POA: Diagnosis not present

## 2024-01-13 MED ORDER — GABAPENTIN 300 MG PO CAPS: 300.0000 mg | ORAL_CAPSULE | Freq: Three times a day (TID) | ORAL | 0 refills | Status: AC

## 2024-01-13 NOTE — Progress Notes (Addendum)
 She presents today for her first postop visit she is status post McBride bunionectomy left foot.  She denies fever chills nausea mobic muscle aches pains calf pain back pain chest pain shortness of breath.  Objective: Presents today with her cam boot dry sterile dressing intact was removed demonstrates skin is dry and intact margins are well coapted she has good passive and active range of motion of the first metatarsophalangeal joint left foot.    Taken today demonstrate a Designer, fashion/clothing in perfect alignment.  No signs of infection.  Assessment well-healing surgical foot.  Plan: Redressed today dressed a compressive dressing placed in a Darco shoe follow-up with her in 1 to 2 weeks for suture.  I did start her on gabapentin  300 mg she will start at nighttime but take can take up to as many as 900 mg if necessary.  She will take one 300 mg tablet 3 times a day.

## 2024-01-27 ENCOUNTER — Ambulatory Visit (INDEPENDENT_AMBULATORY_CARE_PROVIDER_SITE_OTHER): Payer: Self-pay | Admitting: Podiatry

## 2024-01-27 DIAGNOSIS — M2012 Hallux valgus (acquired), left foot: Secondary | ICD-10-CM

## 2024-01-28 NOTE — Progress Notes (Signed)
 Luke presents today for her second postop visit date of surgery January 08, 2024 Sonda [place] bunionectomy.  She denies pain fever chills nausea vomiting.  Objective: Left foot demonstrates great range of motion of the first metatarsophalangeal joint no erythema cellulitis drainage or odor incision sites gone on to heal uneventfully I remove the suture ends today.  No tenderness on range of motion.  Assessment: Well-healing surgical foot.  Plan: Will allow her to get back into her regular shoe gear.

## 2024-02-17 ENCOUNTER — Encounter: Payer: Self-pay | Admitting: Podiatry

## 2024-02-24 ENCOUNTER — Ambulatory Visit (INDEPENDENT_AMBULATORY_CARE_PROVIDER_SITE_OTHER): Payer: Self-pay | Admitting: Podiatry

## 2024-02-24 DIAGNOSIS — M2012 Hallux valgus (acquired), left foot: Secondary | ICD-10-CM

## 2024-02-24 NOTE — Progress Notes (Signed)
 Mary Ayers presents today for follow-up of her [place] bunion repair performed on January 08, 2024.  She states that she is doing just great no problems whatsoever continues to apply topical creams to her scar to help with its appearance.  States that it still covered occasionally so that it does not rub in her shoes.  Objective: Vital signs are stable alert oriented x 3.  There is minimal edema no erythema cellulitis drainage or odor she has fantastic range of motion of the first metatarsal phalangeal joint left foot.  Assessment: Well-healing surgical foot.  Plan: Allow her to get back to her regular activities I did discuss if it becomes tired painful sore to ice and elevate and then continue to try to increase her activity level.  I expressed to her that this would improve over the next 6 months.  If this should become painful she is to notify us  immediately.
